# Patient Record
Sex: Female | Born: 2000
Health system: Southern US, Community
[De-identification: ages and names within clinical notes are randomized; demographics above are authoritative.]

## PROBLEM LIST (undated history)

## (undated) DIAGNOSIS — J302 Other seasonal allergic rhinitis: Secondary | ICD-10-CM

## (undated) DIAGNOSIS — K219 Gastro-esophageal reflux disease without esophagitis: Secondary | ICD-10-CM

## (undated) DIAGNOSIS — E063 Autoimmune thyroiditis: Secondary | ICD-10-CM

## (undated) DIAGNOSIS — F32A Depression, unspecified: Secondary | ICD-10-CM

## (undated) HISTORY — PX: OTHER SURGICAL HISTORY: SHX169

## (undated) HISTORY — DX: Autoimmune thyroiditis: E06.3

## (undated) HISTORY — DX: Other seasonal allergic rhinitis: J30.2

## (undated) NOTE — *Deleted (*Deleted)
It was a pleasure to see you in clinic today.   °Feel free to contact our office during normal business hours at [phone] with questions or concerns. °If you need us urgently after normal business hours, please call the above number to reach our answering service who will contact the on-call pediatric endocrinologist. ° °If you choose to communicate with us via MyChart, please do not send urgent messages as this inbox is NOT monitored on nights or weekends.  Urgent concerns should be discussed with the on-call pediatric endocrinologist. ° °-Take your thyroid medication at the same time every day °-If you forget to take a dose, take it as soon as you remember.  If you don't remember until the next day, take 2 doses then.  NEVER take more than 2 doses at a time. °-Use a pill box to help make it easier to keep track of doses   °

---
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
## 2020-01-06 ENCOUNTER — Ambulatory Visit (INDEPENDENT_AMBULATORY_CARE_PROVIDER_SITE_OTHER): Payer: BLUE CROSS/BLUE SHIELD | Admitting: Pediatrics

## 2020-01-06 NOTE — Progress Notes (Deleted)
Pediatric Endocrinology Follow-up Visit  Chief Complaint: acquired hypothyroidism  HPI: Jacqueline Green is a 19 y.o. female presenting for follow-up of the above concerns.  she attended this visit alone.  1. Jacqueline Green initially presented to [facility] in 06/2015 after her PCP diagnosed primary hypothyroidism.  She had been seen by her PCP on 01/27/15, at which time she complained of weight gain, hair breakage, and fatigue.  Lifestyle modifications were recommended at that time to prevent weight gain.  She went back to her PCP in 03/2015 with similar symptoms (weight gain notably) after making diet changes and blood work was done.  Labs obtained 04/29/2015 showed TSH 124.9, free T4 0.2, hemoglobin A1c 5.6%, CMP normal except ALT slightly elevated at 36 (upper limit of normal 35).  Initial diagnosis of hypothyroidism is consistent with autoimmune hypothyroidism; on 06/07/2015 TSH was 71.542, FT4 0.85, TPO Ab >900, thyroglobulin Ab elevated at 7.  She was started on levothyroxine daily in 2016.  Her dose has been titrated since.  She was also noted to have a mildly low vitamin D level in the past (01/2018-25-OH D level 29); 1000 units vitamin D daily was recommended.    2. Since last visit on 10/21/2019, she has been well.  Thyroid symptoms: Continues on brand name synthroid 25***mcg daily Missed doses: ***  Heat or cold intolerance: ***None Weight changes: *** creased ***lb since last visit Energy level: *** Sleep: *** Skin changes: ***None Hair loss: ***None Constipation/Diarrhea: ***None Difficulty swallowing: ***None Neck swelling: ***None ***Periods regular: *** Tremor: *** Palpitations: ***  Vitamin D deficiency: Most recent vitamin D level: 21 on 10/05/2019 Taking supplementation: yes, taking D3 2000 units daily  ROS: All systems reviewed with pertinent positives listed below; otherwise negative. Constitutional: Weight as above.  Sleeping as above HEENT: *** Respiratory: No increased work  of breathing currently GI: Stooling as above GU: ***puberty changes ***periods as above Musculoskeletal: No joint deformity Neuro: Normal affect Endocrine: As above  Past Medical History:   Past Medical History:  Diagnosis Date  . Acquired autoimmune hypothyroidism    Dx 04/2015.  TSH 124, FT4 0.2. TPO ab > 900  . Seasonal allergies    takes zyrtec and flonase prn   Meds: Augmentin 875mg  BID Brand name synthroid daily Zyrtec alternating with xyzal and flonase prn IUD Biotin "Hair" gummies Vitamin C, D3 (2000 daily), and zinc Occasionally takes a B12 vitamin if she feels she needs more energy  Allergies: Allergies  Allergen Reactions  . Strawberry (Diagnostic) Hives    Surgical History: Past Surgical History:  Procedure Laterality Date  . none      Family History:  Family History  Problem Relation Age of Onset  . Thyroid disease Mother        had benign tumor causing hyperthyroidism in 1991, underwent partial thyroidectomy.  Was on synthroid for years post-op, then around 2010 she was able to stop synthroid    . Hypertension Father   Multiple maternal family members with hypothyroidism including MGM, 3 maternal aunts, and 1 maternal uncle.  Pt has cousin with T1DM, otherwise no other autoimmune diseases in the family  Social History: Lives with: parents (older brother at college) Attends [employer]   Physical Exam:  There were no vitals filed for this visit. There were no vitals taken for this visit. Body mass index: body mass index is unknown because there is no height or weight on file. Blood pressure percentiles are not available for patients who are 18 years or older.  Wt Readings from Last 3 Encounters:  10/21/19 115 lb (52.2 kg) (30 %, Z= -0.52)*  10/05/19 116 lb 9.6 oz (52.9 kg) (34 %, Z= -0.42)*  07/07/19 127 lb (57.6 kg) (56 %, Z= 0.16)*   * Growth percentiles are based on CDC (Girls, 2-20 Years) data.   Ht Readings from Last 3 Encounters:   10/05/19 5' 1.81" (1.57 m) (17 %, Z= -0.95)*  02/19/18 5' 2.01" (1.575 m) (21 %, Z= -0.81)*  09/16/17 5' 2.21" (1.58 m) (24 %, Z= -0.71)*   * Growth percentiles are based on CDC (Girls, 2-20 Years) data.   There is no height or weight on file to calculate BMI.  No weight on file for this encounter. No height on file for this encounter.  General: Well developed, well nourished ***female in no acute distress.  Appears *** stated age Head: Normocephalic, atraumatic.   Eyes:  Pupils equal and round. EOMI.   Sclera white.  No eye drainage.   Ears/Nose/Mouth/Throat: Nares patent, no nasal drainage.  Normal dentition, mucous membranes moist.   Neck: supple, no cervical lymphadenopathy, no thyromegaly Cardiovascular: regular rate, normal S1/S2, no murmurs Respiratory: No increased work of breathing.  Lungs clear to auscultation bilaterally.  No wheezes. Abdomen: soft, nontender, nondistended. Normal bowel sounds.  No appreciable masses  Extremities: warm, well perfused, cap refill < 2 sec.   Musculoskeletal: Normal muscle mass.  Normal strength Skin: warm, dry.  No rash or lesions. Neurologic: alert and oriented, normal speech, no tremor  Laboratory Evaluation: 06/07/15 TSH 71.5, free T4 0.85 09/22/15 TSH 35.812, FT4 1.02, TPO Ab >900, thyroglobulin Ab 7 (<2) 10/20/15 TSH 6.293, FT4 1.09 12/27/15: TSH 0.103, FT4 2.09 02/07/16: TSH 0.82, FT4 1.6, T4 11.1   Results for orders placed or performed in visit on 10/21/19  T4, free  Result Value Ref Range   Free T4 2.0 (H) 0.8 - 1.4 ng/dL  TSH  Result Value Ref Range   TSH <0.01 (L) mIU/L  COMPLETE METABOLIC PANEL WITH GFR  Result Value Ref Range   Glucose, Bld 80 65 - 99 mg/dL   BUN 9 7 - 20 mg/dL   Creat 0.60 0.50 - 1.00 mg/dL   GFR, Est Non African American 133 > OR = 60 mL/min/1.19m2   GFR, Est African American 154 > OR = 60 mL/min/1.17m2   BUN/Creatinine Ratio NOT APPLICABLE 6 - 22 (calc)   Sodium 138 135 - 146 mmol/L   Potassium  4.2 3.8 - 5.1 mmol/L   Chloride 105 98 - 110 mmol/L   CO2 26 20 - 32 mmol/L   Calcium 9.5 8.9 - 10.4 mg/dL   Total Protein 6.5 6.3 - 8.2 g/dL   Albumin 4.3 3.6 - 5.1 g/dL   Globulin 2.2 2.0 - 3.8 g/dL (calc)   AG Ratio 2.0 1.0 - 2.5 (calc)   Total Bilirubin 0.6 0.2 - 1.1 mg/dL   Alkaline phosphatase (APISO) 70 36 - 128 U/L   AST 18 12 - 32 U/L   ALT 18 5 - 32 U/L  CBC with Differential/Platelet  Result Value Ref Range   WBC 7.9 4.5 - 13.0 Thousand/uL   RBC 4.73 3.80 - 5.10 Million/uL   Hemoglobin 12.4 11.5 - 15.3 g/dL   HCT 38.1 34.0 - 46.0 %   MCV 80.5 78.0 - 98.0 fL   MCH 26.2 25.0 - 35.0 pg   MCHC 32.5 31.0 - 36.0 g/dL   RDW 12.8 11.0 - 15.0 %   Platelets 208 140 -  400 Thousand/uL   MPV 11.2 7.5 - 12.5 fL   Neutro Abs 3,642 1,800 - 8,000 cells/uL   Lymphs Abs 2,955 1,200 - 5,200 cells/uL   Absolute Monocytes 450 200 - 900 cells/uL   Eosinophils Absolute 766 (H) 15 - 500 cells/uL   Basophils Absolute 87 0 - 200 cells/uL   Neutrophils Relative % 46.1 %   Total Lymphocyte 37.4 %   Monocytes Relative 5.7 %   Eosinophils Relative 9.7 %   Basophils Relative 1.1 %  T4  Result Value Ref Range   T4, Total 13.6 (H) 5.3 - 11.7 mcg/dL  POCT Glucose (Device for Home Use)  Result Value Ref Range   Glucose Fasting, POC     POC Glucose 100 (A) 70 - 99 mg/dl     Ref. Range 09/16/2017 16:07 02/19/2018 00:00 07/08/2019 08:31 10/05/2019 09:41  TSH Latest Units: mIU/L 0.87 0.51 0.01 (L) <0.01 (L)  T4,Free(Direct) Latest Ref Range: 0.8 - 1.4 ng/dL 1.6 (H) 1.3 2.5 (H) 2.3 (H)  Thyroxine (T4) Latest Ref Range: 5.3 - 11.7 mcg/dL 93.5 (H)  70.1 (H) 77.9 (H)     Ref. Range 07/08/2019 08:31  Mean Plasma Glucose Latest Units: (calc) 94  Vitamin D, 25-Hydroxy Latest Ref Range: 30 - 100 ng/mL 31  Cortisol, Plasma Latest Units: mcg/dL 39.0  eAG (mmol/L) Latest Units: (calc) 5.2  Hemoglobin A1C Latest Ref Range: <5.7 % of total Hgb 4.9   ASSESSMENT/PLAN:*** Kendal Ghazarian is a 19 y.o. female  with autoimmune acquired hypothyroidism who is clinically euthyroid on reduced synthroid dose.  Goal of treatment is TSH in the lower half of the normal range with FT4/T4 in the upper half of the normal range. I cannot explain why she had such a dramatic decrease in thyroid hormone replacement dosing over the past year besides dramatic decrease in weight; changed to brand name synthroid at a lower dose about 2 weeks ago as labs remained hyperthyroid despite decreasing levothyroxine doses.  She also has vitamin D deficiency on supplementation. She has recently had a horse bite to her finger with subsequent syncopal episode while treating wound; she was also subsequently found to have low blood pressure of unknown etiology.  A1c was normal.  She has had a recent morning cortisol level that was normal.    1. Autoimmune Acquired hypothyroidism 2. Loss of weight -Will draw TSH, FT4, T4 today -Continue current levothyroxine pending labs -She is aware of what to do in case of missed doses  3. Vitamin D deficiency -Continue current vitamin D supplement  4. Dizziness -Will check CBC, CMP, TFTs today. -Encouraged to drink gatorade and stay hydrated. Continue to monitor BPs periodically.   The best way to reach [facility] for lab results is [phone]  Follow-up:   3 months   ***   [name], MD

## 2020-03-09 ENCOUNTER — Ambulatory Visit (INDEPENDENT_AMBULATORY_CARE_PROVIDER_SITE_OTHER): Payer: Self-pay | Admitting: Pediatrics

## 2020-03-09 NOTE — Progress Notes (Deleted)
Pediatric Endocrinology Follow-up Visit  Chief Complaint: acquired hypothyroidism  HPI: Jacqueline Green is a 19 y.o. female presenting for follow-up of the above concerns.  she is accompanied to this visit by her ***.   ***She attended this visit alone.  1. Jacqueline Green initially presented to [facility] in 06/2015 after her PCP diagnosed primary hypothyroidism.  She had been seen by her PCP on 01/27/15, at which time she complained of weight gain, hair breakage, and fatigue.  Lifestyle modifications were recommended at that time to prevent weight gain.  She went back to her PCP in 03/2015 with similar symptoms (weight gain notably) after making diet changes and blood work was done.  Labs obtained 04/29/2015 showed TSH 124.9, free T4 0.2, hemoglobin A1c 5.6%, CMP normal except ALT slightly elevated at 36 (upper limit of normal 35).  Initial diagnosis of hypothyroidism is consistent with autoimmune hypothyroidism; on 06/07/2015 TSH was 71.542, FT4 0.85, TPO Ab >900, thyroglobulin Ab elevated at 7.  She was started on levothyroxine 50mcg daily in 2016.  Her dose has been titrated since.  She was also noted to have a mildly low vitamin D level in the past (01/2018-25-OH D level 29); 1000 units vitamin D daily was recommended.    2. Since last visit on 10/21/2019, she has been well.***  Thyroid symptoms: Brand name synthroid 44mcg daily Missed doses: ***   Weight changes: *** Energy level: *** Sleep: *** Constipation/Diarrhea: *** Difficulty swallowing: *** Periods: No periods since IUD placement.  *** Tremor: *** Palpitations: ***  Vitamin D deficiency: Most recent vitamin D level: 21 on 10/05/2019 Taking supplementation: *** D3 2000 units daily  ROS: All systems reviewed with pertinent positives listed below; otherwise negative. Constitutional: Weight as above.  Sleeping as above HEENT: *** Respiratory: No increased work of breathing currently GI: Stooling as above GU: Periods as above Musculoskeletal: No  joint deformity Neuro: Normal affect Endocrine: As above  Past Medical History:   Past Medical History:  Diagnosis Date  . Acquired autoimmune hypothyroidism    Dx 04/2015.  TSH 124, FT4 0.2. TPO ab > 900  . Seasonal allergies    takes zyrtec and flonase prn   Meds: Outpatient Encounter Medications as of 03/09/2020  Medication Sig  . amoxicillin-clavulanate (AUGMENTIN) 875-125 MG tablet Take 1 tablet by mouth 2 (two) times daily.  . Biotin 1000 MCG CHEW Chew by mouth.  . cetirizine (ZYRTEC) 10 MG tablet Take 10 mg by mouth daily.  . cholecalciferol (VITAMIN D3) 25 MCG (1000 UT) tablet Take 1,000 Units by mouth daily.  . fluticasone (FLONASE) 50 MCG/ACT nasal spray Place into the nose.  . levocetirizine (XYZAL) 5 MG tablet Take 5 mg by mouth every evening.  Marland Kitchen levonorgestrel (MIRENA) 20 MCG/24HR IUD 1 each by Intrauterine route once.  . Melatonin 10 MG TABS Take by mouth.  . SYNTHROID 25 MCG tablet Take 1 tablet (25 mcg total) by mouth daily.  Bertram Millard, 28, 0.18/0.215/0.25 MG-35 MCG tablet    No facility-administered encounter medications on file as of 03/09/2020.   Allergies: Allergies  Allergen Reactions  . Strawberry (Diagnostic) Hives    Surgical History: Past Surgical History:  Procedure Laterality Date  . none      Family History:  Family History  Problem Relation Age of Onset  . Thyroid disease Mother        had benign tumor causing hyperthyroidism in 1991, underwent partial thyroidectomy.  Was on synthroid for years post-op, then around 2010 she was able to stop  synthroid    . Hypertension Father   Multiple maternal family members with hypothyroidism including MGM, 3 maternal aunts, and 1 maternal uncle.  Pt has cousin with T1DM, otherwise no other autoimmune diseases in the family  Social History: Lives with: parents (older brother at college) Attends [employer]   Physical Exam:  There were no vitals filed for this visit. There were no vitals taken for this  visit. Body mass index: body mass index is unknown because there is no height or weight on file. Blood pressure percentiles are not available for patients who are 18 years or older.  Wt Readings from Last 3 Encounters:  10/21/19 115 lb (52.2 kg) (30 %, Z= -0.52)*  10/05/19 116 lb 9.6 oz (52.9 kg) (34 %, Z= -0.42)*  07/07/19 127 lb (57.6 kg) (56 %, Z= 0.16)*   * Growth percentiles are based on CDC (Girls, 2-20 Years) data.   Ht Readings from Last 3 Encounters:  10/05/19 5' 1.81" (1.57 m) (17 %, Z= -0.95)*  02/19/18 5' 2.01" (1.575 m) (21 %, Z= -0.81)*  09/16/17 5' 2.21" (1.58 m) (24 %, Z= -0.71)*   * Growth percentiles are based on CDC (Girls, 2-20 Years) data.   There is no height or weight on file to calculate BMI.  No weight on file for this encounter. No height on file for this encounter.  General: Well developed, well nourished ***female in no acute distress.  Appears *** stated age Head: Normocephalic, atraumatic.   Eyes:  Pupils equal and round. EOMI.   Sclera white.  No eye drainage.   Ears/Nose/Mouth/Throat: Nares patent, no nasal drainage.  Normal dentition, mucous membranes moist.   Neck: supple, no cervical lymphadenopathy, no thyromegaly Cardiovascular: regular rate, normal S1/S2, no murmurs Respiratory: No increased work of breathing.  Lungs clear to auscultation bilaterally.  No wheezes. Abdomen: soft, nontender, nondistended. Normal bowel sounds.  No appreciable masses  Genitourinary: Tanner *** breasts, *** axillary hair, Tanner *** pubic hair Extremities: warm, well perfused, cap refill < 2 sec.   Musculoskeletal: Normal muscle mass.  Normal strength Skin: warm, dry.  No rash or lesions. Neurologic: alert and oriented, normal speech, no tremor  Laboratory Evaluation: 06/07/15 TSH 71.5, free T4 0.85 09/22/15 TSH 35.812, FT4 1.02, TPO Ab >900, thyroglobulin Ab 7 (<2) 10/20/15 TSH 6.293, FT4 1.09 12/27/15: TSH 0.103, FT4 2.09 02/07/16: TSH 0.82, FT4 1.6, T4  11.1   Results for orders placed or performed in visit on 10/21/19  T4, free  Result Value Ref Range   Free T4 2.0 (H) 0.8 - 1.4 ng/dL  TSH  Result Value Ref Range   TSH <0.01 (L) mIU/L  COMPLETE METABOLIC PANEL WITH GFR  Result Value Ref Range   Glucose, Bld 80 65 - 99 mg/dL   BUN 9 7 - 20 mg/dL   Creat 0.09 2.33 - 0.07 mg/dL   GFR, Est Non African American 133 > OR = 60 mL/min/1.40m2   GFR, Est African American 154 > OR = 60 mL/min/1.28m2   BUN/Creatinine Ratio NOT APPLICABLE 6 - 22 (calc)   Sodium 138 135 - 146 mmol/L   Potassium 4.2 3.8 - 5.1 mmol/L   Chloride 105 98 - 110 mmol/L   CO2 26 20 - 32 mmol/L   Calcium 9.5 8.9 - 10.4 mg/dL   Total Protein 6.5 6.3 - 8.2 g/dL   Albumin 4.3 3.6 - 5.1 g/dL   Globulin 2.2 2.0 - 3.8 g/dL (calc)   AG Ratio 2.0 1.0 - 2.5 (calc)  Total Bilirubin 0.6 0.2 - 1.1 mg/dL   Alkaline phosphatase (APISO) 70 36 - 128 U/L   AST 18 12 - 32 U/L   ALT 18 5 - 32 U/L  CBC with Differential/Platelet  Result Value Ref Range   WBC 7.9 4.5 - 13.0 Thousand/uL   RBC 4.73 3.80 - 5.10 Million/uL   Hemoglobin 12.4 11.5 - 15.3 g/dL   HCT 78.2 95.6 - 21.3 %   MCV 80.5 78.0 - 98.0 fL   MCH 26.2 25.0 - 35.0 pg   MCHC 32.5 31.0 - 36.0 g/dL   RDW 08.6 57.8 - 46.9 %   Platelets 208 140 - 400 Thousand/uL   MPV 11.2 7.5 - 12.5 fL   Neutro Abs 3,642 1,800 - 8,000 cells/uL   Lymphs Abs 2,955 1,200 - 5,200 cells/uL   Absolute Monocytes 450 200 - 900 cells/uL   Eosinophils Absolute 766 (H) 15 - 500 cells/uL   Basophils Absolute 87 0 - 200 cells/uL   Neutrophils Relative % 46.1 %   Total Lymphocyte 37.4 %   Monocytes Relative 5.7 %   Eosinophils Relative 9.7 %   Basophils Relative 1.1 %  T4  Result Value Ref Range   T4, Total 13.6 (H) 5.3 - 11.7 mcg/dL  POCT Glucose (Device for Home Use)  Result Value Ref Range   Glucose Fasting, POC     POC Glucose 100 (A) 70 - 99 mg/dl     Ref. Range 09/16/2017 16:07 02/19/2018 00:00 07/08/2019 08:31 10/05/2019 09:41   TSH Latest Units: mIU/L 0.87 0.51 0.01 (L) <0.01 (L)  T4,Free(Direct) Latest Ref Range: 0.8 - 1.4 ng/dL 1.6 (H) 1.3 2.5 (H) 2.3 (H)  Thyroxine (T4) Latest Ref Range: 5.3 - 11.7 mcg/dL 62.9 (H)  52.8 (H) 41.3 (H)     Ref. Range 07/08/2019 08:31  Mean Plasma Glucose Latest Units: (calc) 94  Vitamin D, 25-Hydroxy Latest Ref Range: 30 - 100 ng/mL 31  Cortisol, Plasma Latest Units: mcg/dL 24.4  eAG (mmol/L) Latest Units: (calc) 5.2  Hemoglobin A1C Latest Ref Range: <5.7 % of total Hgb 4.9   ASSESSMENT/PLAN: Martha Ellerby is a 19 y.o. female with autoimmune acquired hypothyroidism who is clinically ***thyroid on levothyroxine treatment. Levothyroxine needs have decreased dramatically over the past year for unknown reasons. ***Growth and weight gain are normal.  Goal of treatment is TSH in the lower half of the normal range with FT4/T4 in the upper half of the normal range.   1. Autoimmune Acquired hypothyroidism 2. ***  -Reviewed pituitary/thyroid axis with the family -Will draw TSH, FT4, T4 today -Continue current levothyroxine pending labs -Discussed what to do in case of missed doses -Growth chart reviewed with family    The best way to reach [facility] for lab results is [phone]  Follow-up:   3 months   ***   [name], MD

## 2020-04-20 ENCOUNTER — Ambulatory Visit (INDEPENDENT_AMBULATORY_CARE_PROVIDER_SITE_OTHER): Payer: No Typology Code available for payment source | Admitting: Pediatrics

## 2020-04-20 ENCOUNTER — Other Ambulatory Visit: Payer: Self-pay

## 2020-04-20 ENCOUNTER — Encounter (INDEPENDENT_AMBULATORY_CARE_PROVIDER_SITE_OTHER): Payer: Self-pay | Admitting: Pediatrics

## 2020-04-20 VITALS — BP 104/58 | HR 62 | Ht 62.68 in | Wt 120.4 lb

## 2020-04-20 DIAGNOSIS — E559 Vitamin D deficiency, unspecified: Secondary | ICD-10-CM | POA: Diagnosis not present

## 2020-04-20 DIAGNOSIS — E063 Autoimmune thyroiditis: Secondary | ICD-10-CM

## 2020-04-20 NOTE — Progress Notes (Signed)
Pediatric Endocrinology Follow-up Visit  Chief Complaint: acquired hypothyroidism  HPI: Jacqueline Green is a 19 y.o. female presenting for follow-up of the above concerns.  she attended this visit alone.     1. Jacqueline Green initially presented to [facility] in 06/2015 after her PCP diagnosed primary hypothyroidism.  She had been seen by her PCP on 01/27/15, at which time she complained of weight gain, hair breakage, and fatigue.  Lifestyle modifications were recommended at that time to prevent weight gain.  She went back to her PCP in 03/2015 with similar symptoms (weight gain notably) after making diet changes and blood work was done.  Labs obtained 04/29/2015 showed TSH 124.9, free T4 0.2, hemoglobin A1c 5.6%, CMP normal except ALT slightly elevated at 36 (upper limit of normal 35).  Initial diagnosis of hypothyroidism is consistent with autoimmune hypothyroidism; on 06/07/2015 TSH was 71.542, FT4 0.85, TPO Ab >900, thyroglobulin Ab elevated at 7.  She was started on levothyroxine daily in 2016.  Her dose has been titrated since.  She was also noted to have a mildly low vitamin D level in the past (01/2018-25-OH D level 29); 1000 units vitamin D daily was recommended.    2. Since last visit on 10/21/2019, she has been well.  Synthroid dose changed at last visit to daily as TSH remained suppressed and FT4 elevated.   A little tired, but feels like her thyroid dose is "not far off"  Thyroid symptoms: Brand name synthroid daily Missed doses: none  Weight changes: fluctuates 5lb (115-120lb).  Not trying to lose weight.  Still working at [employer] Energy level: ranks energy level as a 7 out of 8, used to be 4 Sleep: Sleeps "like a rock". Gets up at 5AM, nap from 11-1 daily Constipation/Diarrhea: None Difficulty swallowing: no Periods regular: No periods since IUD placement Tremor: No Palpitations: No  Occasional dizziness if standing too fast or if moves when hot, gets dizzy when very tired.  Drinks a lot during the day (water and coke)  Vitamin D deficiency: Most recent vitamin D level: 21 on 10/05/2019 Taking supplementation: yes, taking D3 2000 units daily  ROS: All systems reviewed with pertinent positives listed below; otherwise negative.  Past Medical History:   Past Medical History:  Diagnosis Date  . Acquired autoimmune hypothyroidism    Dx 04/2015.  TSH 124, FT4 0.2. TPO ab > 900  . Seasonal allergies    takes zyrtec and flonase prn   Meds: Outpatient Encounter Medications as of 04/20/2020  Medication Sig Note  . Biotin 1000 MCG CHEW Chew by mouth.   . cetirizine (ZYRTEC) 10 MG tablet Take 10 mg by mouth daily. 04/20/2020: PRN  . cholecalciferol (VITAMIN D3) 25 MCG (1000 UT) tablet Take 1,000 Units by mouth daily.   Marland Kitchen levonorgestrel (KYLEENA) 19.5 MG IUD by Intrauterine route once.   Marland Kitchen SYNTHROID 25 MCG tablet Take 1 tablet (25 mcg total) by mouth daily.   . fluticasone (FLONASE) 50 MCG/ACT nasal spray Place into the nose.   . levocetirizine (XYZAL) 5 MG tablet Take 5 mg by mouth every evening. 04/20/2020: PRN switches with zyrtec   . Melatonin 10 MG TABS Take by mouth. 04/20/2020: Only when she can't sleep  . [DISCONTINUED] amoxicillin-clavulanate (AUGMENTIN) 875-125 MG tablet Take 1 tablet by mouth 2 (two) times daily.   . [DISCONTINUED] levonorgestrel (MIRENA) 20 MCG/24HR IUD 1 each by Intrauterine route once. 4/29/2021[name] brand  . [DISCONTINUED] TRINESSA, 28, 0.18/0.215/0.25 MG-35 MCG tablet  No facility-administered encounter medications on file as of 04/20/2020.    Allergies: Allergies  Allergen Reactions  . Strawberry (Diagnostic) Hives    Surgical History: Past Surgical History:  Procedure Laterality Date  . none      Family History:  Family History  Problem Relation Age of Onset  . Thyroid disease Mother        had benign tumor causing hyperthyroidism in 1991, underwent partial thyroidectomy.  Was on synthroid for years post-op, then  around 2010 she was able to stop synthroid    . Hypertension Father   Multiple maternal family members with hypothyroidism including MGM, 3 maternal aunts, and 1 maternal uncle.  Pt has cousin with T1DM, otherwise no other autoimmune diseases in the family  Social History: Lives with: parents (older brother at college) Attends [employer]   Physical Exam:  Vitals:   04/20/20 1056  BP: (!) 104/58  Pulse: 62  Weight: 120 lb 6.4 oz (54.6 kg)  Height: 5' 2.68" (1.592 m)   BP (!) 104/58   Pulse 62   Ht 5' 2.68" (1.592 m)   Wt 120 lb 6.4 oz (54.6 kg)   LMP 04/17/2020   BMI 21.55 kg/m  Body mass index: body mass index is 21.55 kg/m. Blood pressure percentiles are not available for patients who are 18 years or older.  Wt Readings from Last 3 Encounters:  04/20/20 120 lb 6.4 oz (54.6 kg) (39 %, Z= -0.27)*  10/21/19 115 lb (52.2 kg) (30 %, Z= -0.52)*  10/05/19 116 lb 9.6 oz (52.9 kg) (34 %, Z= -0.42)*   * Growth percentiles are based on CDC (Girls, 2-20 Years) data.   Ht Readings from Last 3 Encounters:  04/20/20 5' 2.68" (1.592 m) (27 %, Z= -0.62)*  10/05/19 5' 1.81" (1.57 m) (17 %, Z= -0.95)*  02/19/18 5' 2.01" (1.575 m) (21 %, Z= -0.81)*   * Growth percentiles are based on CDC (Girls, 2-20 Years) data.   Body mass index is 21.55 kg/m.  39 %ile (Z= -0.27) based on CDC (Girls, 2-20 Years) weight-for-age data using vitals from 04/20/2020. 27 %ile (Z= -0.62) based on CDC (Girls, 2-20 Years) Stature-for-age data based on Stature recorded on 04/20/2020.  General: Well developed, well nourished female in no acute distress.  Appears stated age Head: Normocephalic, atraumatic.   Eyes:  Pupils equal and round. EOMI.   Sclera white.  No eye drainage.   Ears/Nose/Mouth/Throat: Masked Neck: supple, no cervical lymphadenopathy, no thyromegaly Cardiovascular: regular rate, normal S1/S2, no murmurs Respiratory: No increased work of breathing.  Lungs clear to auscultation bilaterally.  No  wheezes. Abdomen: soft, nontender, nondistended. Normal bowel sounds.  No appreciable masses  Extremities: warm, well perfused, cap refill < 2 sec.   Musculoskeletal: Normal muscle mass.  Normal strength Skin: warm, dry.  No rash or lesions. Neurologic: alert and oriented, normal speech, no tremor  Laboratory Evaluation: 06/07/15 TSH 71.5, free T4 0.85 09/22/15 TSH 35.812, FT4 1.02, TPO Ab >900, thyroglobulin Ab 7 (<2) 10/20/15 TSH 6.293, FT4 1.09 12/27/15: TSH 0.103, FT4 2.09 02/07/16: TSH 0.82, FT4 1.6, T4 11.1    Ref. Range 10/21/2019 10:58  Sodium Latest Ref Range: 135 - 146 mmol/L 138  Potassium Latest Ref Range: 3.8 - 5.1 mmol/L 4.2  Chloride Latest Ref Range: 98 - 110 mmol/L 105  CO2 Latest Ref Range: 20 - 32 mmol/L 26  Glucose Latest Ref Range: 65 - 99 mg/dL 80  BUN Latest Ref Range: 7 - 20 mg/dL 9  Creatinine  Latest Ref Range: 0.50 - 1.00 mg/dL 0.35  Calcium Latest Ref Range: 8.9 - 10.4 mg/dL 9.5  BUN/Creatinine Ratio Latest Ref Range: 6 - 22 (calc) NOT APPLICABLE  AG Ratio Latest Ref Range: 1.0 - 2.5 (calc) 2.0  AST Latest Ref Range: 12 - 32 U/L 18  ALT Latest Ref Range: 5 - 32 U/L 18  Total Protein Latest Ref Range: 6.3 - 8.2 g/dL 6.5  Total Bilirubin Latest Ref Range: 0.2 - 1.1 mg/dL 0.6  GFR, Est Non African American Latest Ref Range: > OR = 60 mL/min/1.47m2 133  GFR, Est African American Latest Ref Range: > OR = 60 mL/min/1.62m2 154  Alkaline phosphatase (APISO) Latest Ref Range: 36 - 128 U/L 70  Globulin Latest Ref Range: 2.0 - 3.8 g/dL (calc) 2.2  WBC Latest Ref Range: 4.5 - 13.0 Thousand/uL 7.9  RBC Latest Ref Range: 3.80 - 5.10 Million/uL 4.73  Hemoglobin Latest Ref Range: 11.5 - 15.3 g/dL 00.9  HCT Latest Ref Range: 34.0 - 46.0 % 38.1  MCV Latest Ref Range: 78.0 - 98.0 fL 80.5  MCH Latest Ref Range: 25.0 - 35.0 pg 26.2  MCHC Latest Ref Range: 31.0 - 36.0 g/dL 38.1  RDW Latest Ref Range: 11.0 - 15.0 % 12.8  Platelets Latest Ref Range: 140 - 400 Thousand/uL 208   MPV Latest Ref Range: 7.5 - 12.5 fL 11.2  Neutrophils Latest Units: % 46.1  Monocytes Relative Latest Units: % 5.7  Eosinophil Latest Units: % 9.7  Basophil Latest Units: % 1.1  NEUT# Latest Ref Range: 1,800 - 8,000 cells/uL 3,642  Lymphocyte # Latest Ref Range: 1,200 - 5,200 cells/uL 2,955  Total Lymphocyte Latest Units: % 37.4  Eosinophils Absolute Latest Ref Range: 15 - 500 cells/uL 766 (H)  Basophils Absolute Latest Ref Range: 0 - 200 cells/uL 87  Absolute Monocytes Latest Ref Range: 200 - 900 cells/uL 450  TSH Latest Units: mIU/L <0.01 (L)  T4,Free(Direct) Latest Ref Range: 0.8 - 1.4 ng/dL 2.0 (H)  Thyroxine (T4) Latest Ref Range: 5.3 - 11.7 mcg/dL 82.9 (H)  Albumin MSPROF Latest Ref Range: 3.6 - 5.1 g/dL 4.3     Ref. Range 09/16/2017 16:07 02/19/2018 00:00 07/08/2019 08:31 10/05/2019 09:41  TSH Latest Units: mIU/L 0.87 0.51 0.01 (L) <0.01 (L)  T4,Free(Direct) Latest Ref Range: 0.8 - 1.4 ng/dL 1.6 (H) 1.3 2.5 (H) 2.3 (H)  Thyroxine (T4) Latest Ref Range: 5.3 - 11.7 mcg/dL 93.7 (H)  16.9 (H) 67.8 (H)     Ref. Range 07/08/2019 08:31  Mean Plasma Glucose Latest Units: (calc) 94  Vitamin D, 25-Hydroxy Latest Ref Range: 30 - 100 ng/mL 31  Cortisol, Plasma Latest Units: mcg/dL 93.8  eAG (mmol/L) Latest Units: (calc) 5.2  Hemoglobin A1C Latest Ref Range: <5.7 % of total Hgb 4.9   ASSESSMENT/PLAN: Jacqueline Green is a 20 y.o. female with autoimmune acquired hypothyroidism who is clinically euthyroid to slightly hypothyroid on reduced synthroid dose (+ fatigue, low HR).  Goal of treatment is TSH in the lower half of the normal range with FT4/T4 in the upper half of the normal range. She has been a challenging case as levothyroxine needs have dropped dramatically over the past year.  She is also taking biotin, which may interfere with TSH lab testing. She also has vitamin D deficiency on supplementation.   1. Autoimmune Acquired hypothyroidism  -Will draw TSH, FT4, T4 today -Continue  current levothyroxine pending labs.  Reviewed that next dose up would be 37.74mcg daily -She is aware of what  to do in case of missed doses  2. Vitamin D deficiency -Continue current vitamin D supplement -Repeat 25-OH D level today  The best way to reach [facility] for lab results is [phone]  Follow-up:   3 months   >30 minutes spent today reviewing the medical chart, counseling the patient/family, and documenting today's encounter.  [name], MD  -------------------------------- 04/21/20 11:06 AM ADDENDUM: Labs show low TSH with low normal FT4/T4.  Biotin can cause falsely low results of TSH level.  Given clinical picture, will increase synthroid to 37.19mcg daily (can take 1.5 of current tabs then increase to half of tab).  Will ask her to stop biotin for the next 3 months and will repeat labs again at next visit in 3 months.  Vitamin D level just below normal, will increase vitamin D supplement slightly (take an extra vitamin D tablet daily on Sat/Sun).  Will have my nursing staff contact her with results/plan.  Results for orders placed or performed in visit on 04/20/20  T4, free  Result Value Ref Range   Free T4 1.1 0.8 - 1.4 ng/dL  T4  Result Value Ref Range   T4, Total 7.3 5.3 - 11.7 mcg/dL  TSH  Result Value Ref Range   TSH 0.01 (L) mIU/L  VITAMIN D 25 Hydroxy (Vit-D Deficiency, Fractures)  Result Value Ref Range   Vit D, 25-Hydroxy 26 (L) 30 - 100 ng/mL

## 2020-04-20 NOTE — Patient Instructions (Addendum)
It was a pleasure to see you in clinic today.   Feel free to contact our office during normal business hours at [phone] with questions or concerns. If you need [place] urgently after normal business hours, please call the above number to reach our answering service who will contact the on-call pediatric endocrinologist.  If you choose to communicate with [place] via MyChart, please do not send urgent messages as this inbox is NOT monitored on nights or weekends.  Urgent concerns should be discussed with the on-call pediatric endocrinologist.  -Take your thyroid medication at the same time every day -If you forget to take a dose, take it as soon as you remember.  If you don't remember until the next day, take 2 doses then.  NEVER take more than 2 doses at a time. -Use a pill box to help make it easier to keep track of doses   I will be in touch with lab results  Please go to the following address to have labs drawn after today's visit: [address] [place], [place] [address]

## 2020-04-21 ENCOUNTER — Telehealth (INDEPENDENT_AMBULATORY_CARE_PROVIDER_SITE_OTHER): Payer: Self-pay

## 2020-04-21 LAB — VITAMIN D 25 HYDROXY (VIT D DEFICIENCY, FRACTURES): Vit D, 25-Hydroxy: 26 ng/mL — ABNORMAL LOW (ref 30–100)

## 2020-04-21 LAB — T4, FREE: Free T4: 1.1 ng/dL (ref 0.8–1.4)

## 2020-04-21 LAB — TSH: TSH: 0.01 mIU/L — ABNORMAL LOW

## 2020-04-21 LAB — T4: T4, Total: 7.3 ug/dL (ref 5.3–11.7)

## 2020-04-21 MED ORDER — SYNTHROID 75 MCG PO TABS: 37.5000 ug | ORAL_TABLET | Freq: Every day | ORAL | 5 refills | Status: DC

## 2020-04-21 NOTE — Telephone Encounter (Signed)
Spoke with patient informed her per Dr. Larinda Buttery  "Your TSH level is low, and I think it is falsely low due to the biotin you are taking.  Your other thyroid labs are at the lower end of normal and we have room to increase your synthroid dose.  Please increase synthroid to 37.64mcg daily (you can take 1.5 of the tabs until you run out of your current bottle, then when you pick up your new prescription you will take half of a tablet daily).  Please stop taking biotin for the next 3 months and we will repeat your thyroid labs when you come back.    Your vitamin D level is improved though still just below normal.  Please increase the vitamin D amount you take on Saturday and Sunday by 1 pill (so if you are taking 1 vitamin D tablet per day on Sat/Sun, increase it to 2 pills daily Sat/Sun)."  Patient verbalized understanding.

## 2020-04-21 NOTE — Addendum Note (Signed)
Addended by: Judene Companion on: 04/21/2020 11:29 AM   Modules accepted: Orders

## 2020-04-21 NOTE — Telephone Encounter (Signed)
-----   Message from [name], MD sent at 04/21/2020 11:27 AM EDT ----- Your TSH level is low, and I think it is falsely low due to the biotin you are taking.  Your other thyroid labs are at the lower end of normal and we have room to increase your synthroid dose.  Please increase synthroid to 37.50mcg daily (you can take 1.5 of the tabs until you run out of your current bottle, then when you pick up your new prescription you will take half of a tablet daily).  Please stop taking biotin for the next 3 months and we will repeat your thyroid labs when you come back.    Your vitamin D level is improved though still just below normal.  Please increase the vitamin D amount you take on Saturday and Sunday by 1 pill (so if you are taking 1 vitamin D tablet per day on Sat/Sun, increase it to 2 pills daily Sat/Sun).  Please call her at [phone] to let her know the above information. Thanks!

## 2020-07-20 ENCOUNTER — Ambulatory Visit (INDEPENDENT_AMBULATORY_CARE_PROVIDER_SITE_OTHER): Payer: No Typology Code available for payment source | Admitting: Pediatrics

## 2020-07-20 ENCOUNTER — Other Ambulatory Visit: Payer: Self-pay

## 2020-07-20 ENCOUNTER — Encounter (INDEPENDENT_AMBULATORY_CARE_PROVIDER_SITE_OTHER): Payer: Self-pay | Admitting: Pediatrics

## 2020-07-20 VITALS — BP 112/68 | HR 72 | Ht 62.64 in | Wt 110.6 lb

## 2020-07-20 DIAGNOSIS — E063 Autoimmune thyroiditis: Secondary | ICD-10-CM | POA: Diagnosis not present

## 2020-07-20 DIAGNOSIS — E559 Vitamin D deficiency, unspecified: Secondary | ICD-10-CM

## 2020-07-20 NOTE — Progress Notes (Addendum)
Pediatric Endocrinology Follow-up Visit  Chief Complaint: acquired hypothyroidism  HPI: Jacqueline Green is a 19 y.o. female presenting for follow-up of the above concerns.  she attended this visit alone.     1. Jacqueline Green initially presented to [facility] in 06/2015 after her PCP diagnosed primary hypothyroidism.  She had been seen by her PCP on 01/27/15, at which time she complained of weight gain, hair breakage, and fatigue.  Lifestyle modifications were recommended at that time to prevent weight gain.  She went back to her PCP in 03/2015 with similar symptoms (weight gain notably) after making diet changes and blood work was done.  Labs obtained 04/29/2015 showed TSH 124.9, free T4 0.2, hemoglobin A1c 5.6%, CMP normal except ALT slightly elevated at 36 (upper limit of normal 35).  Initial diagnosis of hypothyroidism is consistent with autoimmune hypothyroidism; on 06/07/2015 TSH was 71.542, FT4 0.85, TPO Ab >900, thyroglobulin Ab elevated at 7.  She was started on levothyroxine daily in 2016.  Her dose has been titrated since.  She was also noted to have a mildly low vitamin D level in the past (01/2018-25-OH D level 29); 1000 units vitamin D daily was recommended.    2. Since last visit on 04/20/20, she has been well.  Recently in [place], sweated a lot while there.  Lost 5lb according to her home scale.  Feels great.  Has more energy than she has had in a while.  Stopped biotin at her last visit with me.   Thyroid symptoms: Brand name synthroid 37.77mcg daily Missed doses: None  Weight changes: down 10lb.  Eating well.  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  Better when taken with food.  Interested in weekly ergocalciferol if she still needs vitamin D supplement  ROS: All systems reviewed with pertinent positives listed below; otherwise negative. Constitutional: Weight has decreased 10lb since last visit.   No further dizziness.  Hair healthier and less brittle  Past Medical History:   Past Medical History:  Diagnosis Date  . Acquired autoimmune hypothyroidism    Dx 04/2015.  TSH 124, FT4 0.2. TPO ab > 900  . Seasonal allergies    takes zyrtec and flonase prn   Meds: Outpatient Encounter Medications as of 07/20/2020  Medication Sig Note  . cetirizine (ZYRTEC) 10 MG tablet Take 10 mg by mouth daily. 04/20/2020: PRN  . cholecalciferol (VITAMIN D3) 25 MCG (1000 UT) tablet Take 1,000 Units by mouth daily.   Marland Kitchen levocetirizine (XYZAL) 5 MG tablet Take 5 mg by mouth every evening. 04/20/2020: PRN switches with zyrtec   . levonorgestrel (KYLEENA) 19.5 MG IUD by Intrauterine route once.   . Melatonin 10 MG TABS Take by mouth. 04/20/2020: Only when she can't sleep  . SYNTHROID 75 MCG tablet Take 0.5 tablets (37.5 mcg total) by mouth daily.   . Biotin 1000 MCG CHEW Chew by mouth. (Patient not taking: Reported on 07/20/2020)   . fluticasone (FLONASE) 50 MCG/ACT nasal spray Place into the nose. (Patient not taking: Reported on 07/20/2020)    No facility-administered encounter medications on file as of 07/20/2020.    Allergies: Allergies  Allergen Reactions  . Strawberry (Diagnostic) Hives    Surgical History: Past Surgical History:  Procedure Laterality Date  . none      Family History:  Family History  Problem Relation Age of Onset  . Thyroid disease Mother        had benign tumor causing hyperthyroidism in 1991, underwent partial thyroidectomy.  Was on synthroid for years post-op, then around 2010 she was able to stop synthroid    . Hypertension Father   Multiple  maternal family members with hypothyroidism including MGM, 3 maternal aunts, and 1 maternal uncle.  Pt has cousin with T1DM, otherwise no other autoimmune diseases in the family  Social History: Lives with: parents (older brother at college) Attends [employer], no classes this summer  Physical Exam:  Vitals:   07/20/20 1022  BP: 112/68  Pulse: 72  Weight: 110 lb 10.1 oz (50.2 kg)  Height: 5' 2.64" (1.591 m)   BP 112/68   Pulse 72   Ht 5' 2.64" (1.591 m)   Wt 110 lb 10.1 oz (50.2 kg)   BMI 19.82 kg/m  Body mass index: body mass index is 19.82 kg/m. Blood pressure percentiles are not available for patients who are 18 years or older.  Wt Readings from Last 3 Encounters:  07/20/20 110 lb 10.1 oz (50.2 kg) (18 %, Z= -0.91)*  04/20/20 120 lb 6.4 oz (54.6 kg) (39 %, Z= -0.27)*  10/21/19 115 lb (52.2 kg) (30 %, Z= -0.52)*   * Growth percentiles are based on CDC (Girls, 2-20 Years) data.   Ht Readings from Last 3 Encounters:  07/20/20 5' 2.64" (1.591 m) (26 %, Z= -0.64)*  04/20/20 5' 2.68" (1.592 m) (27 %, Z= -0.62)*  10/05/19 5' 1.81" (1.57 m) (17 %, Z= -0.95)*   * Growth percentiles are based on CDC (Girls, 2-20 Years) data.   Body mass index is 19.82 kg/m.  18 %ile (Z= -0.91) based on CDC (Girls, 2-20 Years) weight-for-age data using vitals from 07/20/2020. 26 %ile (Z= -0.64) based on CDC (Girls, 2-20 Years) Stature-for-age data based on Stature recorded on 07/20/2020.  General: Well developed, well nourished female in no acute distress.  Appears stated age Head: Normocephalic, atraumatic.   Eyes:  Pupils equal and round. EOMI.   Sclera white.  No eye drainage.   Ears/Nose/Mouth/Throat: Masked Neck: supple, no cervical lymphadenopathy, no thyromegaly Cardiovascular: regular rate, normal S1/S2, no murmurs Respiratory: No increased work of breathing.  Lungs clear to auscultation bilaterally.  No wheezes. Abdomen: soft, nontender, nondistended.  Extremities: warm, well perfused,  cap refill < 2 sec.   Musculoskeletal: Normal muscle mass.  Normal strength Skin: warm, dry.  No rash or lesions. Neurologic: alert and oriented, normal speech, no tremor  Laboratory Evaluation: 06/07/15 TSH 71.5, free T4 0.85 09/22/15 TSH 35.812, FT4 1.02, TPO Ab >900, thyroglobulin Ab 7 (<2) 10/20/15 TSH 6.293, FT4 1.09 12/27/15: TSH 0.103, FT4 2.09 02/07/16: TSH 0.82, FT4 1.6, T4 11.1    Ref. Range 10/21/2019 10:58  Sodium Latest Ref Range: 135 - 146 mmol/L 138  Potassium Latest Ref Range: 3.8 - 5.1 mmol/L 4.2  Chloride Latest Ref Range: 98 - 110 mmol/L 105  CO2 Latest Ref Range: 20 - 32 mmol/L 26  Glucose Latest Ref Range: 65 - 99 mg/dL 80  BUN Latest Ref Range: 7 - 20 mg/dL 9  Creatinine Latest Ref Range: 0.50 - 1.00 mg/dL 2.75  Calcium Latest Ref Range: 8.9 - 10.4 mg/dL 9.5  BUN/Creatinine Ratio Latest Ref Range: 6 -  22 (calc) NOT APPLICABLE  AG Ratio Latest Ref Range: 1.0 - 2.5 (calc) 2.0  AST Latest Ref Range: 12 - 32 U/L 18  ALT Latest Ref Range: 5 - 32 U/L 18  Total Protein Latest Ref Range: 6.3 - 8.2 g/dL 6.5  Total Bilirubin Latest Ref Range: 0.2 - 1.1 mg/dL 0.6  GFR, Est Non African American Latest Ref Range: > OR = 60 mL/min/1.64m2 133  GFR, Est African American Latest Ref Range: > OR = 60 mL/min/1.45m2 154  Alkaline phosphatase (APISO) Latest Ref Range: 36 - 128 U/L 70  Globulin Latest Ref Range: 2.0 - 3.8 g/dL (calc) 2.2  WBC Latest Ref Range: 4.5 - 13.0 Thousand/uL 7.9  RBC Latest Ref Range: 3.80 - 5.10 Million/uL 4.73  Hemoglobin Latest Ref Range: 11.5 - 15.3 g/dL 16.1  HCT Latest Ref Range: 34.0 - 46.0 % 38.1  MCV Latest Ref Range: 78.0 - 98.0 fL 80.5  MCH Latest Ref Range: 25.0 - 35.0 pg 26.2  MCHC Latest Ref Range: 31.0 - 36.0 g/dL 09.6  RDW Latest Ref Range: 11.0 - 15.0 % 12.8  Platelets Latest Ref Range: 140 - 400 Thousand/uL 208  MPV Latest Ref Range: 7.5 - 12.5 fL 11.2  Neutrophils Latest Units: % 46.1  Monocytes Relative Latest Units: % 5.7  Eosinophil  Latest Units: % 9.7  Basophil Latest Units: % 1.1  NEUT# Latest Ref Range: 1,800 - 8,000 cells/uL 3,642  Lymphocyte # Latest Ref Range: 1,200 - 5,200 cells/uL 2,955  Total Lymphocyte Latest Units: % 37.4  Eosinophils Absolute Latest Ref Range: 15 - 500 cells/uL 766 (H)  Basophils Absolute Latest Ref Range: 0 - 200 cells/uL 87  Absolute Monocytes Latest Ref Range: 200 - 900 cells/uL 450  TSH Latest Units: mIU/L <0.01 (L)  T4,Free(Direct) Latest Ref Range: 0.8 - 1.4 ng/dL 2.0 (H)  Thyroxine (T4) Latest Ref Range: 5.3 - 11.7 mcg/dL 04.5 (H)  Albumin MSPROF Latest Ref Range: 3.6 - 5.1 g/dL 4.3    Ref. Range 04/20/2020 11:40  Vitamin D, 25-Hydroxy Latest Ref Range: 30 - 100 ng/mL 26 (L)  TSH Latest Units: mIU/L 0.01 (L)  T4,Free(Direct) Latest Ref Range: 0.8 - 1.4 ng/dL 1.1  Thyroxine (T4) Latest Ref Range: 5.3 - 11.7 mcg/dL 7.3    ASSESSMENT/PLAN: Erline Siddoway is a 19 y.o. female with autoimmune acquired hypothyroidism who is clinically euthyroid on levothyroxine treatment. My only concern is that she has had continued weight loss which she attributes to increased activity and sweating.  She reports good appetite and continued eating.  Her thyroid lab trend has been erratic (was requiring larger doses of levothyroxine then had weight loss and significant reduction of thyroid hormone needs; I also questioned in the past whether lab results were accurate as she was taking biotin, which she has since stopped).  Goal of treatment is TSH in the lower half of the normal range with FT4/T4 in the upper half of the normal range.   1. Autoimmune Acquired hypothyroidism -Will draw TSH, FT4, T4 today -Continue current levothyroxine pending labs  2. Vitamin D deficiency -Repeat 25-OH D level today -If supplement still needed, will consider ergocalciferol 50,000 units weekly as she sometimes gets nauseous with daily Vit D pill.   The best way to reach Jacqueline Green for lab results is [phone]  Follow-up:   Return in about 3 months (around 10/20/2020).    >30 minutes spent today reviewing the medical chart, counseling the patient/family, and documenting today's encounter.  [name], MD  --------------------------------  07/21/20 10:29 AM ADDENDUM: TSH continues to be suppressed off biotin, so will stop levothyroxine x 1 month to determine if she actually needs levothyroxine supplementation any longer.  Will draw TSH, FT4, T4 in 1 month (ordered).   Vit D remains low, will change to ergocalciferol 50,000 units weekly x 8 weeks. Rx sent  Will have my nursing staff call her with the message below:  Results for orders placed or performed in visit on 07/20/20  T4  Result Value Ref Range   T4, Total 9.1 5.3 - 11.7 mcg/dL  T4, free  Result Value Ref Range   Free T4 1.6 (H) 0.8 - 1.4 ng/dL  TSH  Result Value Ref Range   TSH 0.01 (L) mIU/L  VITAMIN D 25 Hydroxy (Vit-D Deficiency, Fractures)  Result Value Ref Range   Vit D, 25-Hydroxy 21 (L) 30 - 100 ng/mL    Your thyroid labs show that your dose of thyroid medicine is too high.  I actually question whether you need the thyroid medicine anymore.  Based on your labs, I want you to stop your thyroid medicine for 1 month, then have labs drawn so we can determine if you actually need thyroid medicine.  I put in lab orders at [facility] so in 1 month you can go to any [facility] location (or come to our office any day except Thursday) to have labs drawn.  I will call when these results are back and we can decide at that time whether we need to restart the medicine.    Your vitamin D level is still low at 21 (we want it above 30), so I sent a prescription for high dose vitamin D that you will take one pill per week.  You can stop taking the daily vitamin D.  -------------------------------- 07/26/20 11:17 AM ADDENDUM: Jacqueline Green very worried about how she will feel if stopping levothyroxine completely.  I called and spoke with Jacqueline Green, explained that her  labs show that current dose of levothyroxine is too high.  Will drop her down to half of 25mcg tab (12.505mcg daily) and repeat labs in 1 month.  Will also order thyroid ultrasound given mom's history.  -------------------------------- 08/11/20 10:44 AM ADDENDUM: 08/08/20 CLINICAL DATA:  19 year old female with a history of autoimmune hypothyroidism  EXAM: THYROID ULTRASOUND  TECHNIQUE: Ultrasound examination of the thyroid gland and adjacent soft tissues was performed.  COMPARISON:  None.  FINDINGS: Parenchymal Echotexture: Markedly heterogenous  Isthmus: 0.3 cm  Right lobe: 5.0 cm x 1.5 cm x 1.6 cm  Left lobe: 4.7 cm x 1.5 cm x 1.7 cm  _________________________________________________________  Estimated total number of nodules >/= 1 cm: 0  Number of spongiform nodules >/=  2 cm not described below (TR1): 0  Number of mixed cystic and solid nodules >/= 1.5 cm not described below (TR2): 0  _________________________________________________________  Nodule # 1:  Location: Right; Inferior  Maximum size: 0.9 cm; Other 2 dimensions: 0.8 cm x 0.4 cm  Composition: spongiform (0)  ACR TI-RADS recommendations:  Spongiform nodule does not meet criteria for surveillance or biopsy  _________________________________________________________  No adenopathy  IMPRESSION: Heterogeneous thyroid compatible with medical thyroid disease.  No thyroid nodule meets criteria for biopsy or surveillance, as designated by the newly established ACR TI-RADS criteria.  Recommendations follow those established by the new ACR TI-RADS criteria (J Am Coll Radiol 2017;14:587-595).   Electronically Signed   By: Gilmer MorJaime  Wagner D.O.   On: 08/09/2020 15:32    Called Jacqueline Green to discuss  results.  Ultrasound showed a small benign appearing spongiform nodule.  There is a possibility that this could be an autonomous nodule, so may need to consider an uptake scan in the future  though I want to repeat thyroid labs on lower dose of levothyroxine to see if TSH improves on its own.  Plan to repeat TSH, FT4, T4 on 12.51mcg levothyroxine x 1 month.  [name], MD  -------------------------------- 09/07/20 10:30 AM ADDENDUM: Labs drawn 08/31/2020 by PCP: TSH 0.23 (0.34-4.5) FT4 1.08 (0.61-1.12) FT3 2.72 (2.5-3.9) CMP unremarkable except gluc 55 (likely falsely low due to blood sitting in the tube) CBC unremarkable A1c 5.3%  Called Jacqueline Green to discuss results.  She is still taking levothyroxine 12.2mcg daily. Feeling ok.     She asked about vitamin D level; it does not appear that it was drawn.  She completed her course of ergocalciferol 50,000units/week.  Will send in an additional rx for ergo 50,000units/week x 6 weeks.  Will repeat thyroid labs and 25-OHD level at next visit with me on 10/24/2020.

## 2020-07-20 NOTE — Patient Instructions (Addendum)
It was a pleasure to see you in clinic today.   Feel free to contact our office during normal business hours at [phone] with questions or concerns. If you need [place] urgently after normal business hours, please call the above number to reach our answering service who will contact the on-call pediatric endocrinologist.  If you choose to communicate with [place] via MyChart, please do not send urgent messages as this inbox is NOT monitored on nights or weekends.  Urgent concerns should be discussed with the on-call pediatric endocrinologist.  -Take your thyroid medication at the same time every day -If you forget to take a dose, take it as soon as you remember.  If you don't remember until the next day, take 2 doses then.  NEVER take more than 2 doses at a time. -Use a pill box to help make it easier to keep track of doses   Please go to the following address to have labs drawn after today's visit: [address] [place], [place] [address]

## 2020-07-21 LAB — VITAMIN D 25 HYDROXY (VIT D DEFICIENCY, FRACTURES): Vit D, 25-Hydroxy: 21 ng/mL — ABNORMAL LOW (ref 30–100)

## 2020-07-21 LAB — TSH: TSH: 0.01 mIU/L — ABNORMAL LOW

## 2020-07-21 LAB — T4, FREE: Free T4: 1.6 ng/dL — ABNORMAL HIGH (ref 0.8–1.4)

## 2020-07-21 LAB — T4: T4, Total: 9.1 ug/dL (ref 5.3–11.7)

## 2020-07-21 MED ORDER — ERGOCALCIFEROL 1.25 MG (50000 UT) PO CAPS: 50000.0000 [IU] | ORAL_CAPSULE | ORAL | 0 refills | Status: DC

## 2020-07-21 NOTE — Addendum Note (Signed)
Addended by: Judene Companion on: 07/21/2020 10:32 AM   Modules accepted: Orders

## 2020-07-24 ENCOUNTER — Telehealth (INDEPENDENT_AMBULATORY_CARE_PROVIDER_SITE_OTHER): Payer: Self-pay | Admitting: Pediatrics

## 2020-07-24 DIAGNOSIS — E063 Autoimmune thyroiditis: Secondary | ICD-10-CM

## 2020-07-24 NOTE — Telephone Encounter (Signed)
  Who's calling (name and relationship to patient) : [place] (self)  Best contact number: [phone]  Provider they see: Dr. Larinda Buttery  Reason for call: Patient states that she had a missed call from this office & she thought it might be about her lab results. I did not see anything in the chart - I told patient I would let the clinical staff know that she has called.    PRESCRIPTION REFILL ONLY  Name of prescription:  Pharmacy:

## 2020-07-25 NOTE — Telephone Encounter (Signed)
-----   Message from [name], MD sent at 07/21/2020 10:29 AM EDT ----- Please call Jacqueline Green at [phone] with the following message:  Your thyroid labs show that your dose of thyroid medicine is too high.  I actually question whether you need the thyroid medicine anymore.  Based on your labs, I want you to stop your thyroid medicine for 1 month, then have labs drawn so we can determine if you actually need thyroid medicine.  I put in lab orders at [facility] so in 1 month you can go to any [facility] location (or come to our office any day except Thursday) to have labs drawn.  I will call when these results are back and we can decide at that time whether we need to restart the medicine.    Your vitamin D level is still low at 21 (we want it above 30), so I sent a prescription for high dose vitamin D that you will take one pill per week.  You can stop taking the daily vitamin D.

## 2020-07-25 NOTE — Telephone Encounter (Signed)
Contacted patient and let her know per Dr. Larinda Buttery "Your thyroid labs show that your dose of thyroid medicine is too high.  I actually question whether you need the thyroid medicine anymore.  Based on your labs, I want you to stop your thyroid medicine for 1 month, then have labs drawn so we can determine if you actually need thyroid medicine.  I put in lab orders at [facility] so in 1 month you can go to any [facility] location (or come to our office any day except Thursday) to have labs drawn.  I will call when these results are back and we can decide at that time whether we need to restart the medicine.    Your vitamin D level is still low at 21 (we want it above 30), so I sent a prescription for high dose vitamin D that you will take one pill per week.  You can stop taking the daily vitamin D."   Patient states that she will stop taking her medication, but she is going to feel like "garbage"  States in the past if she has forgotten to take her medications and has "felt like death, is very tired and has no energy."   Informed patient that this message would be passed to the provider to see if she wanted to keep the original plan.

## 2020-07-26 ENCOUNTER — Telehealth (INDEPENDENT_AMBULATORY_CARE_PROVIDER_SITE_OTHER): Payer: Self-pay | Admitting: Pediatrics

## 2020-07-26 NOTE — Telephone Encounter (Signed)
I called [place].  Please see addendum to my most recent clinic note for details.   [name], MD

## 2020-07-26 NOTE — Telephone Encounter (Signed)
  Who's calling (name and relationship to patient) :mom / [name]   Best contact [phone]  Provider they see:Dr. Larinda Buttery   Reason for call:Mom would like a call back to discuss test results that were given to [facility] this morning.      PRESCRIPTION REFILL ONLY  Name of prescription:  Pharmacy:

## 2020-07-26 NOTE — Addendum Note (Signed)
Addended by: Judene Companion on: 07/26/2020 11:20 AM   Modules accepted: Orders

## 2020-07-31 NOTE — Telephone Encounter (Signed)
Mom called back requesting to speak to Dr. Larinda Buttery about these test results and mom states patient doesn't have a prescription for synthroid.

## 2020-08-02 MED ORDER — LEVOTHYROXINE SODIUM 25 MCG PO TABS: 12.5000 ug | ORAL_TABLET | Freq: Every day | ORAL | 3 refills | Status: DC

## 2020-08-02 NOTE — Telephone Encounter (Signed)
Do we have the signed form for me to be able to talk to mom?  Pt is 18.  I can put in the prescription for her and send it.  Thanks

## 2020-08-02 NOTE — Telephone Encounter (Signed)
Sent Rx for pharmacy for levothyroxine 12.27mcg daily.

## 2020-08-02 NOTE — Addendum Note (Signed)
Addended byJudene Companion on: 08/02/2020 09:25 AM   Modules accepted: Orders

## 2020-08-02 NOTE — Telephone Encounter (Signed)
Returned call to mom to discuss lab results, reduction in levothyroxine dose to 12. daily (half of tab), and plan to perform thyroid ultrasound to see if there is an explanation for why she no longer needs as much levothyroxine (rule out autonomously secreting nodule, etc).  Mom had partial thyroidectomy as a young adult and has had problems with her weight ever since.  Mom worried that Jacqueline Green thinks she will have to have her thyroid removed also.  Explained that I am looking with ultrasound to see if there is an explanation for change in labs, I do not anticipate that she will need thyroidectomy. Mom OK with info given.    Jacqueline Needle, MD

## 2020-08-04 ENCOUNTER — Other Ambulatory Visit: Payer: Self-pay

## 2020-08-08 ENCOUNTER — Ambulatory Visit
Admission: RE | Admit: 2020-08-08 | Discharge: 2020-08-08 | Disposition: A | Discharge status: Home / Self Care | Payer: No Typology Code available for payment source | Source: Ambulatory Visit | Attending: Pediatrics | Admitting: Pediatrics

## 2020-08-08 DIAGNOSIS — E063 Autoimmune thyroiditis: Secondary | ICD-10-CM

## 2020-09-07 MED ORDER — ERGOCALCIFEROL 1.25 MG (50000 UT) PO CAPS: 50000.0000 [IU] | ORAL_CAPSULE | ORAL | 0 refills | Status: DC

## 2020-09-07 NOTE — Addendum Note (Signed)
Addended byJudene Companion on: 09/07/2020 10:36 AM   Modules accepted: Orders

## 2020-09-22 ENCOUNTER — Telehealth (INDEPENDENT_AMBULATORY_CARE_PROVIDER_SITE_OTHER): Payer: Self-pay | Admitting: Pediatrics

## 2020-09-22 NOTE — Telephone Encounter (Signed)
  Who's calling (name and relationship to patient) : [place] (self)  Best contact number: [phone]  Provider they see: Dr. Larinda Buttery  Reason for call: Patient states that she just recently saw a gastroenterologist and they have prescribed a medication that she is not comfortable taking without speaking with Dr. Larinda Buttery. Requests call back.    PRESCRIPTION REFILL ONLY  Name of prescription:  Pharmacy:

## 2020-09-22 NOTE — Telephone Encounter (Signed)
Contacted patient and she informs she is prescribed Omeprazole 40MG  QD for frequent nausea and occasional emesis. Patient wanted to ensure this medication was okay to take with her other medications. Informed patient that if she is currently taking this medication she should not take it at the same time as her Levothyroxine she needs to take Levothyroxine first, then 2 hours later take the Omeprazole.   Let patient know that Dr. will not return to the office until Tuesday so this call can be routed to the on call provider. Or it can be routed to Dr. Thursday to respond on Tuesday.  Patient states that she is okay waiting until Tuesday for a response. Will route to Dr. Sunday

## 2020-09-26 ENCOUNTER — Telehealth (INDEPENDENT_AMBULATORY_CARE_PROVIDER_SITE_OTHER): Payer: Self-pay | Admitting: Pediatrics

## 2020-09-26 NOTE — Telephone Encounter (Signed)
It is fine for her to take omeptrazole with levothyroxine, though she should take both at the same time.  Please call to let her know this.   [name], MD

## 2020-09-26 NOTE — Telephone Encounter (Signed)
Who's calling (name and relationship to patient) : Jovanna Snead self   Best contact number: [phone]  Provider they see: Dr Larinda Buttery   Reason for call: Patient called stating that she is having issues swallowing. She went to her PCP and they said that they didn't find anything but she wanted to let Dr. Larinda Buttery know. She is requesting call back to discuss further.   Call ID:      PRESCRIPTION REFILL ONLY  Name of prescription:  Pharmacy:

## 2020-09-27 NOTE — Telephone Encounter (Signed)
Left voicemail for patient to call back. 

## 2020-09-27 NOTE — Telephone Encounter (Signed)
Contacted patient and let her know she should take medications at the same time. See other phone note for further details.

## 2020-09-27 NOTE — Telephone Encounter (Signed)
I reviewed her thyroid ultrasound.  It is unlikely that thyroid nodule/thyroid size is causing difficulty swallowing.  Will advise to let me know if it gets worse; she has an appt with me in about 1 month.    Also recommend she contact PCP for leg complaints.    [name], MD

## 2020-09-27 NOTE — Telephone Encounter (Signed)
Contacted patient and she informs that she is able to swallow, and has not chocked, or gagged on anything. She states it feels as though there is a lump in her throat.   She also informs that this morning she has had pain and tingling in her left leg below the knee. She states it is a 2/10, and does not feel as though it is traveling. 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 She does not report any swelling. When asked if there was any redness, or if it felt hot to the touch she states "not anymore than the other"  She uses a [place] IUD as contraception. Informed patient this information would be passed on to Dr. Larinda Buttery.

## 2020-10-24 ENCOUNTER — Encounter (INDEPENDENT_AMBULATORY_CARE_PROVIDER_SITE_OTHER): Payer: Self-pay

## 2020-10-24 ENCOUNTER — Ambulatory Visit (INDEPENDENT_AMBULATORY_CARE_PROVIDER_SITE_OTHER): Payer: No Typology Code available for payment source | Admitting: Pediatrics

## 2020-10-24 NOTE — Progress Notes (Deleted)
Pediatric Endocrinology Follow-up Visit  Chief Complaint: acquired hypothyroidism  HPI: Jacqueline Green is a 19 y.o. female presenting for follow-up of the above concerns.  she attended this visit*** alone.     1. Jacqueline Green initially presented to [facility] in 06/2015 after her PCP diagnosed primary hypothyroidism.  She had been seen by her PCP on 01/27/15, at which time she complained of weight gain, hair breakage, and fatigue.  Lifestyle modifications were recommended at that time to prevent weight gain.  She went back to her PCP in 03/2015 with similar symptoms (weight gain notably) after making diet changes and blood work was done.  Labs obtained 04/29/2015 showed TSH 124.9, free T4 0.2, hemoglobin A1c 5.6%, CMP normal except ALT slightly elevated at 36 (upper limit of normal 35).  Initial diagnosis of hypothyroidism is consistent with autoimmune hypothyroidism; on 06/07/2015 TSH was 71.542, FT4 0.85, TPO Ab >900, thyroglobulin Ab elevated at 7.  She was started on levothyroxine daily in 2016.  Her dose has been titrated since. She also had a spongiform thyroid nodule on ultrasound.  She was also noted to have a mildly low vitamin D level in the past and has been taking supplementation.  2. Since last visit on 729/21, she has been well.  ***.   Dose of levothyroxine decreased at last visit as TSH was suppressed.  Repeat TSH 1 month later was improved (though still low) at 0.23 with normal FT4.    Thyroid symptoms: Brand name synthroid 12. daily Missed doses: None  Weight changes: *** Energy level: *** Sleep: good*** Appetite *** Constipation/Diarrhea: None*** Difficulty swallowing: no*** Periods regular: IUD, rare spotting Tremor: No*** Palpitations: No***  Vitamin D deficiency: Most recent vitamin D level: 26 in 03/2020 Taking supplementation: taking ergo 50,000 units weekly x 12 weeks Due for repeat Vit D level today  ROS: All systems reviewed with pertinent positives listed below;  otherwise negative. Constitutional: Weight has ***creased ***lb since last visit.       Past Medical History:   Past Medical History:  Diagnosis Date  . Acquired autoimmune hypothyroidism    Dx 04/2015.  TSH 124, FT4 0.2. TPO ab > 900  . Seasonal allergies    takes zyrtec and flonase prn   Meds: Outpatient Encounter Medications as of 10/24/2020  Medication Sig Note  . cetirizine (ZYRTEC) 10 MG tablet Take 10 mg by mouth daily. 04/20/2020: PRN  . cholecalciferol (VITAMIN D3) 25 MCG (1000 UT) tablet Take 1,000 Units by mouth daily.   . ergocalciferol (VITAMIN D2) 1.25 MG (50000 UT) capsule Take 1 capsule (50,000 Units total) by mouth once a week.   . levocetirizine (XYZAL) 5 MG tablet Take 5 mg by mouth every evening. 04/20/2020: PRN switches with zyrtec   . levonorgestrel (KYLEENA) 19.5 MG IUD by Intrauterine route once.   Marland Kitchen levothyroxine (SYNTHROID) 25 MCG tablet Take 0.5 tablets (12.5 mcg total) by mouth daily.   . Melatonin 10 MG TABS Take by mouth. 04/20/2020: Only when she can't sleep   No facility-administered encounter medications on file as of 10/24/2020.    Allergies: Allergies  Allergen Reactions  . Strawberry (Diagnostic) Hives    Surgical History: Past Surgical History:  Procedure Laterality Date  . none      Family History:  Family History  Problem Relation Age of Onset  . Thyroid disease Mother        had benign tumor causing hyperthyroidism in 1991, underwent partial thyroidectomy.  Was on synthroid for years post-op, then around  2010 she was able to stop synthroid    . Hypertension Father   Multiple maternal family members with hypothyroidism including MGM, 3 maternal aunts, and 1 maternal uncle.  Pt has cousin with T1DM, otherwise no other autoimmune diseases in the family  Social History: Lives with: parents (older brother at college) Attends [employer]  Physical Exam:  There were no vitals filed for this visit. There were no vitals taken for this visit. Body  mass index: body mass index is unknown because there is no height or weight on file. Blood pressure percentiles are not available for patients who are 18 years or older.  Wt Readings from Last 3 Encounters:  07/20/20 110 lb 10.1 oz (50.2 kg) (18 %, Z= -0.91)*  04/20/20 120 lb 6.4 oz (54.6 kg) (39 %, Z= -0.27)*  10/21/19 115 lb (52.2 kg) (30 %, Z= -0.52)*   * Growth percentiles are based on CDC (Girls, 2-20 Years) data.   Ht Readings from Last 3 Encounters:  07/20/20 5' 2.64" (1.591 m) (26 %, Z= -0.64)*  04/20/20 5' 2.68" (1.592 m) (27 %, Z= -0.62)*  10/05/19 5' 1.81" (1.57 m) (17 %, Z= -0.95)*   * Growth percentiles are based on CDC (Girls, 2-20 Years) data.   There is no height or weight on file to calculate BMI.  No weight on file for this encounter. No height on file for this encounter.  General: Well developed, well nourished female in no acute distress.  Appears *** stated age Head: Normocephalic, atraumatic.   Eyes:  Pupils equal and round. EOMI.   Sclera white.  No eye drainage.   Ears/Nose/Mouth/Throat: Masked Neck: supple, no cervical lymphadenopathy, no thyromegaly Cardiovascular: regular rate, normal S1/S2, no murmurs Respiratory: No increased work of breathing.  Lungs clear to auscultation bilaterally.  No wheezes. Abdomen: soft, nontender, nondistended.  Extremities: warm, well perfused, cap refill < 2 sec.   Musculoskeletal: Normal muscle mass.  Normal strength Skin: warm, dry.  No rash or lesions. Neurologic: alert and oriented, normal speech, no tremor   Laboratory Evaluation: 06/07/15 TSH 71.5, free T4 0.85 09/22/15 TSH 35.812, FT4 1.02, TPO Ab >900, thyroglobulin Ab 7 (<2) 10/20/15 TSH 6.293, FT4 1.09 12/27/15: TSH 0.103, FT4 2.09 02/07/16: TSH 0.82, FT4 1.6, T4 11.1    Ref. Range 10/21/2019 10:58  Sodium Latest Ref Range: 135 - 146 mmol/L 138  Potassium Latest Ref Range: 3.8 - 5.1 mmol/L 4.2  Chloride Latest Ref Range: 98 - 110 mmol/L 105  CO2 Latest Ref  Range: 20 - 32 mmol/L 26  Glucose Latest Ref Range: 65 - 99 mg/dL 80  BUN Latest Ref Range: 7 - 20 mg/dL 9  Creatinine Latest Ref Range: 0.50 - 1.00 mg/dL 1.610.60  Calcium Latest Ref Range: 8.9 - 10.4 mg/dL 9.5  BUN/Creatinine Ratio Latest Ref Range: 6 - 22 (calc) NOT APPLICABLE  AG Ratio Latest Ref Range: 1.0 - 2.5 (calc) 2.0  AST Latest Ref Range: 12 - 32 U/L 18  ALT Latest Ref Range: 5 - 32 U/L 18  Total Protein Latest Ref Range: 6.3 - 8.2 g/dL 6.5  Total Bilirubin Latest Ref Range: 0.2 - 1.1 mg/dL 0.6  GFR, Est Non African American Latest Ref Range: > OR = 60 mL/min/1.7473m2 133  GFR, Est African American Latest Ref Range: > OR = 60 mL/min/1.6673m2 154  Alkaline phosphatase (APISO) Latest Ref Range: 36 - 128 U/L 70  Globulin Latest Ref Range: 2.0 - 3.8 g/dL (calc) 2.2  WBC Latest Ref Range: 4.5 -  13.0 Thousand/uL 7.9  RBC Latest Ref Range: 3.80 - 5.10 Million/uL 4.73  Hemoglobin Latest Ref Range: 11.5 - 15.3 g/dL 66.5  HCT Latest Ref Range: 34.0 - 46.0 % 38.1  MCV Latest Ref Range: 78.0 - 98.0 fL 80.5  MCH Latest Ref Range: 25.0 - 35.0 pg 26.2  MCHC Latest Ref Range: 31.0 - 36.0 g/dL 99.3  RDW Latest Ref Range: 11.0 - 15.0 % 12.8  Platelets Latest Ref Range: 140 - 400 Thousand/uL 208  MPV Latest Ref Range: 7.5 - 12.5 fL 11.2  Neutrophils Latest Units: % 46.1  Monocytes Relative Latest Units: % 5.7  Eosinophil Latest Units: % 9.7  Basophil Latest Units: % 1.1  NEUT# Latest Ref Range: 1,800 - 8,000 cells/uL 3,642  Lymphocyte # Latest Ref Range: 1,200 - 5,200 cells/uL 2,955  Total Lymphocyte Latest Units: % 37.4  Eosinophils Absolute Latest Ref Range: 15 - 500 cells/uL 766 (H)  Basophils Absolute Latest Ref Range: 0 - 200 cells/uL 87  Absolute Monocytes Latest Ref Range: 200 - 900 cells/uL 450  TSH Latest Units: mIU/L <0.01 (L)  T4,Free(Direct) Latest Ref Range: 0.8 - 1.4 ng/dL 2.0 (H)  Thyroxine (T4) Latest Ref Range: 5.3 - 11.7 mcg/dL 57.0 (H)  Albumin MSPROF Latest Ref Range: 3.6 -  5.1 g/dL 4.3    Ref. Range 04/20/2020 11:40  Vitamin D, 25-Hydroxy Latest Ref Range: 30 - 100 ng/mL 26 (L)  TSH Latest Units: mIU/L 0.01 (L)  T4,Free(Direct) Latest Ref Range: 0.8 - 1.4 ng/dL 1.1  Thyroxine (T4) Latest Ref Range: 5.3 - 11.7 mcg/dL 7.3     Ref. Range 07/20/2020 11:00  Vitamin D, 25-Hydroxy Latest Ref Range: 30 - 100 ng/mL 21 (L)  TSH Latest Units: mIU/L 0.01 (L)  T4,Free(Direct) Latest Ref Range: 0.8 - 1.4 ng/dL 1.6 (H)  Thyroxine (T4) Latest Ref Range: 5.3 - 11.7 mcg/dL 9.1    Labs drawn 12/29/7937 by PCP: TSH 0.23 (0.34-4.5) FT4 1.08 (0.61-1.12) FT3 2.72 (2.5-3.9) CMP unremarkable except gluc 55 (likely falsely low due to blood sitting in the tube) CBC unremarkable A1c 5.3% ------------------------------------------------------------------------------------------- 08/08/20 CLINICAL DATA:  19 year old female with a history of autoimmune hypothyroidism  EXAM: THYROID ULTRASOUND  TECHNIQUE: Ultrasound examination of the thyroid gland and adjacent soft tissues was performed.  COMPARISON:  None.  FINDINGS: Parenchymal Echotexture: Markedly heterogenous  Isthmus: 0.3 cm  Right lobe: 5.0 cm x 1.5 cm x 1.6 cm  Left lobe: 4.7 cm x 1.5 cm x 1.7 cm  _________________________________________________________  Estimated total number of nodules >/= 1 cm: 0  Number of spongiform nodules >/=  2 cm not described below (TR1): 0  Number of mixed cystic and solid nodules >/= 1.5 cm not described below (TR2): 0  _________________________________________________________  Nodule # 1:  Location: Right; Inferior  Maximum size: 0.9 cm; Other 2 dimensions: 0.8 cm x 0.4 cm  Composition: spongiform (0)  ACR TI-RADS recommendations:  Spongiform nodule does not meet criteria for surveillance or biopsy  _________________________________________________________  No adenopathy  IMPRESSION: Heterogeneous thyroid compatible with medical thyroid  disease.  No thyroid nodule meets criteria for biopsy or surveillance, as designated by the newly established ACR TI-RADS criteria.  Recommendations follow those established by the new ACR TI-RADS criteria (J Am Coll Radiol 2017;14:587-595).   Electronically Signed   By: Gilmer Mor D.O.   On: 08/09/2020 15:32   ASSESSMENT/PLAN:*** Jacqueline Green is a 19 y.o. female with autoimmune acquired hypothyroidism who is clinically euthyroid on levothyroxine treatment. My only concern is that she  has had continued weight loss which she attributes to increased activity and sweating.  She reports good appetite and continued eating.  Her thyroid lab trend has been erratic (was requiring larger doses of levothyroxine then had weight loss and significant reduction of thyroid hormone needs; I also questioned in the past whether lab results were accurate as she was taking biotin, which she has since stopped).  Goal of treatment is TSH in the lower half of the normal range with FT4/T4 in the upper half of the normal range.   1. Autoimmune Acquired hypothyroidism -Will draw TSH, FT4, T4 today -Continue current levothyroxine pending labs  2. Vitamin D deficiency -Repeat 25-OH D level today -If supplement still needed, will consider ergocalciferol 50,000 units weekly as she sometimes gets nauseous with daily Vit D pill.   The best way to reach [facility] for lab results is [phone]  Follow-up:  No follow-ups on file.    >30 minutes spent today reviewing the medical chart, counseling the patient/family, and documenting today's encounter.  [name], MD  -------------------------------- 10/24/20 5:36 AM ADDENDUM: TSH continues to be suppressed off biotin, so will stop levothyroxine x 1 month to determine if she actually needs levothyroxine supplementation any longer.  Will draw TSH, FT4, T4 in 1 month (ordered).   Vit D remains low, will change to ergocalciferol 50,000 units weekly x  8 weeks. Rx sent  Will have my nursing staff call her with the message below:  Results for orders placed or performed in visit on 07/20/20  T4  Result Value Ref Range   T4, Total 9.1 5.3 - 11.7 mcg/dL  T4, free  Result Value Ref Range   Free T4 1.6 (H) 0.8 - 1.4 ng/dL  TSH  Result Value Ref Range   TSH 0.01 (L) mIU/L  VITAMIN D 25 Hydroxy (Vit-D Deficiency, Fractures)  Result Value Ref Range   Vit D, 25-Hydroxy 21 (L) 30 - 100 ng/mL

## 2020-12-28 ENCOUNTER — Other Ambulatory Visit: Payer: Self-pay

## 2020-12-28 ENCOUNTER — Encounter (INDEPENDENT_AMBULATORY_CARE_PROVIDER_SITE_OTHER): Payer: Self-pay | Admitting: Pediatrics

## 2020-12-28 ENCOUNTER — Ambulatory Visit (INDEPENDENT_AMBULATORY_CARE_PROVIDER_SITE_OTHER): Payer: No Typology Code available for payment source | Admitting: Pediatrics

## 2020-12-28 VITALS — BP 124/69 | HR 83 | Ht 62.6 in | Wt 123.0 lb

## 2020-12-28 DIAGNOSIS — E559 Vitamin D deficiency, unspecified: Secondary | ICD-10-CM | POA: Diagnosis not present

## 2020-12-28 DIAGNOSIS — E063 Autoimmune thyroiditis: Secondary | ICD-10-CM | POA: Diagnosis not present

## 2020-12-28 NOTE — Progress Notes (Addendum)
Pediatric Endocrinology Follow-up Visit  Chief Complaint: acquired hypothyroidism  HPI: Jacqueline Green is a 20 y.o. female presenting for follow-up of the above concerns.  she attended this visit alone.     1. Jacqueline Green initially presented to [facility] in 06/2015 after her PCP diagnosed primary hypothyroidism.  She had been seen by her PCP on 01/27/15, at which time she complained of weight gain, hair breakage, and fatigue.  Lifestyle modifications were recommended at that time to prevent weight gain.  She went back to her PCP in 03/2015 with similar symptoms (weight gain notably) after making diet changes and blood work was done.  Labs obtained 04/29/2015 showed TSH 124.9, free T4 0.2, hemoglobin A1c 5.6%, CMP normal except ALT slightly elevated at 36 (upper limit of normal 35).  Initial diagnosis of hypothyroidism is consistent with autoimmune hypothyroidism; on 06/07/2015 TSH was 71.542, FT4 0.85, TPO Ab >900, thyroglobulin Ab elevated at 7.  She was started on levothyroxine daily in 2016.  Her dose has been titrated since.  She was also noted to have a mildly low vitamin D level in the past (01/2018-25-OH D level 29); 1000 units vitamin D daily was recommended.    2. Since last visit on 07/20/20, she has been well.  Worried that her neck is swelling (feels like submandibular lymph nodes), worried this may be related to her thyroid nodule.  Provided reassurance that it is likely unrelated.  Thyroid symptoms: Brand name synthroid 12. daily Missed doses: every once in a while  Heat or cold intolerance: neither Weight changes: Weight has increased 13lb since last visit.     Energy level: good Sleep: good, no naps anymore Skin changes: No, face breaking out more but thinks related to period Constipation/Diarrhea: no Difficulty swallowing: none, though worried she will not be able to breathe if her thyroid gets too enlarged Neck swelling: no Periods regular: IUD in place Tremor: no Palpitations:  no  Vitamin D deficiency: Most recent vitamin D level: 26 in 03/2020 Taking supplementation: Completed ergocalciferol 50,000 units per week Due for Vit D level today  ROS: All systems reviewed with pertinent positives listed below; otherwise negative. Worried that glucose on CMP performed by PCP was low at 55.  Explained this was likely due to sitting in the tube too long before it was run.  Will repeat glucose and A1c today.  She reports eating well.  Past Medical History:   Past Medical History:  Diagnosis Date  . Acquired autoimmune hypothyroidism    Dx 04/2015.  TSH 124, FT4 0.2. TPO ab > 900  . Seasonal allergies    takes zyrtec and flonase prn   Meds: Outpatient Encounter Medications as of 12/28/2020  Medication Sig Note  . cetirizine (ZYRTEC) 10 MG tablet Take 10 mg by mouth daily. 04/20/2020: PRN  . ergocalciferol (VITAMIN D2) 1.25 MG (50000 UT) capsule Take 1 capsule (50,000 Units total) by mouth once a week.   . levocetirizine (XYZAL) 5 MG tablet Take 5 mg by mouth every evening. 04/20/2020: PRN switches with zyrtec   . levonorgestrel (KYLEENA) 19.5 MG IUD by Intrauterine route once.   Marland Kitchen levothyroxine (SYNTHROID) 25 MCG tablet Take 0.5 tablets (12.5 mcg total) by mouth daily.   . cholecalciferol (VITAMIN D3) 25 MCG (1000 UT) tablet Take 1,000 Units by mouth daily. (Patient not taking: Reported on 12/28/2020)   . Melatonin 10 MG TABS Take by mouth. (Patient not taking: Reported on 12/28/2020) 04/20/2020: Only when she can't sleep  . omeprazole (PRILOSEC)  20 MG capsule TAKE 1 CAPSULE BY MOUTH TWICE DAILY 30 MINUTES BEFORE A MEAL ON AN EMPTY STOMACH (Patient not taking: Reported on 12/28/2020)    No facility-administered encounter medications on file as of 12/28/2020.    Allergies: Allergies  Allergen Reactions  . Strawberry (Diagnostic) Hives    Surgical History: Past Surgical History:  Procedure Laterality Date  . none      Family History:  Family History  Problem Relation  Age of Onset  . Thyroid disease Mother        had benign tumor causing hyperthyroidism in 1991, underwent partial thyroidectomy.  Was on synthroid for years post-op, then around 2010 she was able to stop synthroid    . Hypertension Father   Multiple maternal family members with hypothyroidism including MGM, 3 maternal aunts, and 1 maternal uncle.  Pt has cousin with T1DM, otherwise no other autoimmune diseases in the family  Social History: Lives with: parents (older brother at college) Attends [employer]  Physical Exam:  Vitals:   12/28/20 0929  BP: 124/69  Pulse: 83  Weight: 123 lb (55.8 kg)  Height: 5' 2.6" (1.59 m)   BP 124/69   Pulse 83   Ht 5' 2.6" (1.59 m)   Wt 123 lb (55.8 kg)   BMI 22.07 kg/m  Body mass index: body mass index is 22.07 kg/m. Blood pressure percentiles are not available for patients who are 18 years or older.  Wt Readings from Last 3 Encounters:  12/28/20 123 lb (55.8 kg) (41 %, Z= -0.21)*  07/20/20 110 lb 10.1 oz (50.2 kg) (18 %, Z= -0.91)*  04/20/20 120 lb 6.4 oz (54.6 kg) (39 %, Z= -0.27)*   * Growth percentiles are based on CDC (Girls, 2-20 Years) data.   Ht Readings from Last 3 Encounters:  12/28/20 5' 2.6" (1.59 m) (25 %, Z= -0.66)*  07/20/20 5' 2.64" (1.591 m) (26 %, Z= -0.64)*  04/20/20 5' 2.68" (1.592 m) (27 %, Z= -0.62)*   * Growth percentiles are based on CDC (Girls, 2-20 Years) data.   Body mass index is 22.07 kg/m.  41 %ile (Z= -0.21) based on CDC (Girls, 2-20 Years) weight-for-age data using vitals from 12/28/2020. 25 %ile (Z= -0.66) based on CDC (Girls, 2-20 Years) Stature-for-age data based on Stature recorded on 12/28/2020.  General: Well developed, well nourished female in no acute distress.  Appears stated age Head: Normocephalic, atraumatic.   Eyes:  Pupils equal and round. EOMI.   Sclera white.  No eye drainage.   Ears/Nose/Mouth/Throat: Masked Neck: supple, mild submandibular lymphadenopathy, no thyromegaly, thyroid palpable with  smooth texture Cardiovascular: regular rate, normal S1/S2, no murmurs Respiratory: No increased work of breathing.  Lungs clear to auscultation bilaterally.  No wheezes. Abdomen: soft, nontender, nondistended.  Extremities: warm, well perfused, cap refill < 2 sec.   Musculoskeletal: Normal muscle mass.  Normal strength Skin: warm, dry.  No rash or lesions. Neurologic: alert and oriented, normal speech, no tremor   Laboratory Evaluation: 06/07/15 TSH 71.5, free T4 0.85 09/22/15 TSH 35.812, FT4 1.02, TPO Ab >900, thyroglobulin Ab 7 (<2) 10/20/15 TSH 6.293, FT4 1.09 12/27/15: TSH 0.103, FT4 2.09 02/07/16: TSH 0.82, FT4 1.6, T4 11.1    Ref. Range 10/21/2019 10:58  Sodium Latest Ref Range: 135 - 146 mmol/L 138  Potassium Latest Ref Range: 3.8 - 5.1 mmol/L 4.2  Chloride Latest Ref Range: 98 - 110 mmol/L 105  CO2 Latest Ref Range: 20 - 32 mmol/L 26  Glucose Latest Ref Range: 65 -  99 mg/dL 80  BUN Latest Ref Range: 7 - 20 mg/dL 9  Creatinine Latest Ref Range: 0.50 - 1.00 mg/dL 4.78  Calcium Latest Ref Range: 8.9 - 10.4 mg/dL 9.5  BUN/Creatinine Ratio Latest Ref Range: 6 - 22 (calc) NOT APPLICABLE  AG Ratio Latest Ref Range: 1.0 - 2.5 (calc) 2.0  AST Latest Ref Range: 12 - 32 U/L 18  ALT Latest Ref Range: 5 - 32 U/L 18  Total Protein Latest Ref Range: 6.3 - 8.2 g/dL 6.5  Total Bilirubin Latest Ref Range: 0.2 - 1.1 mg/dL 0.6  GFR, Est Non African American Latest Ref Range: > OR = 60 mL/min/1.68m2 133  GFR, Est African American Latest Ref Range: > OR = 60 mL/min/1.61m2 154  Alkaline phosphatase (APISO) Latest Ref Range: 36 - 128 U/L 70  Globulin Latest Ref Range: 2.0 - 3.8 g/dL (calc) 2.2  WBC Latest Ref Range: 4.5 - 13.0 Thousand/uL 7.9  RBC Latest Ref Range: 3.80 - 5.10 Million/uL 4.73  Hemoglobin Latest Ref Range: 11.5 - 15.3 g/dL 29.5  HCT Latest Ref Range: 34.0 - 46.0 % 38.1  MCV Latest Ref Range: 78.0 - 98.0 fL 80.5  MCH Latest Ref Range: 25.0 - 35.0 pg 26.2  MCHC Latest Ref Range:  31.0 - 36.0 g/dL 62.1  RDW Latest Ref Range: 11.0 - 15.0 % 12.8  Platelets Latest Ref Range: 140 - 400 Thousand/uL 208  MPV Latest Ref Range: 7.5 - 12.5 fL 11.2  Neutrophils Latest Units: % 46.1  Monocytes Relative Latest Units: % 5.7  Eosinophil Latest Units: % 9.7  Basophil Latest Units: % 1.1  NEUT# Latest Ref Range: 1,800 - 8,000 cells/uL 3,642  Lymphocyte # Latest Ref Range: 1,200 - 5,200 cells/uL 2,955  Total Lymphocyte Latest Units: % 37.4  Eosinophils Absolute Latest Ref Range: 15 - 500 cells/uL 766 (H)  Basophils Absolute Latest Ref Range: 0 - 200 cells/uL 87  Absolute Monocytes Latest Ref Range: 200 - 900 cells/uL 450  TSH Latest Units: mIU/L <0.01 (L)  T4,Free(Direct) Latest Ref Range: 0.8 - 1.4 ng/dL 2.0 (H)  Thyroxine (T4) Latest Ref Range: 5.3 - 11.7 mcg/dL 30.8 (H)  Albumin MSPROF Latest Ref Range: 3.6 - 5.1 g/dL 4.3     Ref. Range 04/20/2020 11:40 07/20/2020 11:00  Vitamin D, 25-Hydroxy Latest Ref Range: 30 - 100 ng/mL 26 (L) 21 (L)  TSH Latest Units: mIU/L 0.01 (L) 0.01 (L)  T4,Free(Direct) Latest Ref Range: 0.8 - 1.4 ng/dL 1.1 1.6 (H)  Thyroxine (T4) Latest Ref Range: 5.3 - 11.7 mcg/dL 7.3 9.1   Labs drawn 05/27/7845 by PCP: TSH 0.23 (0.34-4.5) FT4 1.08 (0.61-1.12) FT3 2.72 (2.5-3.9) CMP unremarkable except gluc 55 CBC unremarkable A1c 5.3%  -------------------------------- 12/28/20 5:26 AM ADDENDUM: 08/08/20 CLINICAL DATA:  20 year old female with a history of autoimmune hypothyroidism  EXAM: THYROID ULTRASOUND  TECHNIQUE: Ultrasound examination of the thyroid gland and adjacent soft tissues was performed.  COMPARISON:  None.  FINDINGS: Parenchymal Echotexture: Markedly heterogenous  Isthmus: 0.3 cm  Right lobe: 5.0 cm x 1.5 cm x 1.6 cm  Left lobe: 4.7 cm x 1.5 cm x 1.7 cm  _________________________________________________________  Estimated total number of nodules >/= 1 cm: 0  Number of spongiform nodules >/=  2 cm not described  below (TR1): 0  Number of mixed cystic and solid nodules >/= 1.5 cm not described below (TR2): 0  _________________________________________________________  Nodule # 1:  Location: Right; Inferior  Maximum size: 0.9 cm; Other 2 dimensions: 0.8 cm x 0.4 cm  Composition: spongiform (0)  ACR TI-RADS recommendations:  Spongiform nodule does not meet criteria for surveillance or biopsy  _________________________________________________________  No adenopathy  IMPRESSION: Heterogeneous thyroid compatible with medical thyroid disease.  No thyroid nodule meets criteria for biopsy or surveillance, as designated by the newly established ACR TI-RADS criteria.  Recommendations follow those established by the new ACR TI-RADS criteria (J Am Coll Radiol 4268;34:196-222).   Electronically Signed   By: Corrie Mckusick D.O.   On: 08/09/2020 15:32    ------------------------------------  ASSESSMENT/PLAN: Jacqueline Green is a 20 y.o. female with autoimmune acquired hypothyroidism who is clinically euthyroid on levothyroxine treatment.  Her thyroid lab trend has been erratic (was requiring larger doses of levothyroxine then had weight loss and significant reduction of thyroid hormone needs; she appears to be doing well on current dosing. Goal of treatment is TSH in the lower half of the normal range with FT4/T4 in the upper half of the normal range. Due for repeat labs today.  Also with vitamin D deficiency and one glucose reading of 55 without symptoms; will repeat glucose and A1c today.   1. Autoimmune Acquired hypothyroidism -Will draw TSH, FT4, T4 today -Continue current levothyroxine pending labs -Reassurance provided regarding lymph nodes due to location rather than thyroid nodule  2. Vitamin D deficiency -Repeat 25-OH D level today  Will also draw A1c and glucose  The best way to reach [facility] for lab results is [phone]  Follow-up:  Return in about 3 months  (around 03/28/2021).   >30 minutes spent today reviewing the medical chart, counseling the patient/family, and documenting today's encounter.   Levon Hedger, MD  -------------------------------- 12/29/20 11:21 AM ADDENDUM: Results for orders placed or performed in visit on 12/28/20  T4, free  Result Value Ref Range   Free T4 1.1 0.8 - 1.4 ng/dL  T4  Result Value Ref Range   T4, Total 6.9 5.3 - 11.7 mcg/dL  TSH  Result Value Ref Range   TSH 0.54 mIU/L  Hemoglobin A1c  Result Value Ref Range   Hgb A1c MFr Bld 4.9 <5.7 % of total Hgb   Mean Plasma Glucose 94 mg/dL   eAG (mmol/L) 5.2 mmol/L  Glucose  Result Value Ref Range   Glucose, Plasma 82 65 - 139 mg/dL  VITAMIN D 25 Hydroxy (Vit-D Deficiency, Fractures)  Result Value Ref Range   Vit D, 25-Hydroxy 29 (L) 30 - 100 ng/mL    Will have nursing staff call Jacqueline Green with the following message: Your thyroid labs are normal!  Please continue your current dose of thyroid medicine.   Your blood sugar level was normal at 82 and your average blood sugar level over 3 months (A1c) was also normal.   Your vitamin D level is improved though is still just below normal (we want it above 30 and yours is at 29).  I want you to take another course of the high dose once-a-week vitamin D.  I sent a prescription to your pharmacy for this.  Please let me know if you have questions!

## 2020-12-28 NOTE — Patient Instructions (Addendum)
It was a pleasure to see you in clinic today.   Feel free to contact our office during normal business hours at [phone] with questions or concerns. If you need [place] urgently after normal business hours, please call the above number to reach our answering service who will contact the on-call pediatric endocrinologist.  If you choose to communicate with [place] via MyChart, please do not send urgent messages as this inbox is NOT monitored on nights or weekends.  Urgent concerns should be discussed with the on-call pediatric endocrinologist.  -Take your thyroid medication at the same time every day -If you forget to take a dose, take it as soon as you remember.  If you don't remember until the next day, take 2 doses then.  NEVER take more than 2 doses at a time. -Use a pill box to help make it easier to keep track of doses    Please go to the following address to have labs drawn after today's visit: [address] [place], [place] [address]

## 2020-12-29 LAB — HEMOGLOBIN A1C
Hgb A1c MFr Bld: 4.9 % of total Hgb (ref <5.7)
Mean Plasma Glucose: 94 mg/dL
eAG (mmol/L): 5.2 mmol/L

## 2020-12-29 LAB — GLUCOSE, RANDOM: Glucose, Plasma: 82 mg/dL (ref 65–139)

## 2020-12-29 LAB — TSH: TSH: 0.54 mIU/L

## 2020-12-29 LAB — VITAMIN D 25 HYDROXY (VIT D DEFICIENCY, FRACTURES): Vit D, 25-Hydroxy: 29 ng/mL — ABNORMAL LOW (ref 30–100)

## 2020-12-29 LAB — T4: T4, Total: 6.9 ug/dL (ref 5.3–11.7)

## 2020-12-29 LAB — T4, FREE: Free T4: 1.1 ng/dL (ref 0.8–1.4)

## 2020-12-29 MED ORDER — ERGOCALCIFEROL 1.25 MG (50000 UT) PO CAPS: 50000.0000 [IU] | ORAL_CAPSULE | ORAL | 0 refills | Status: DC

## 2020-12-29 NOTE — Addendum Note (Signed)
Addended by: Judene Companion on: 12/29/2020 11:22 AM   Modules accepted: Orders

## 2021-01-23 ENCOUNTER — Other Ambulatory Visit (INDEPENDENT_AMBULATORY_CARE_PROVIDER_SITE_OTHER): Payer: Self-pay | Admitting: Pediatrics

## 2021-01-23 DIAGNOSIS — E063 Autoimmune thyroiditis: Secondary | ICD-10-CM

## 2021-03-29 ENCOUNTER — Encounter (INDEPENDENT_AMBULATORY_CARE_PROVIDER_SITE_OTHER): Payer: Self-pay | Admitting: Pediatrics

## 2021-03-29 ENCOUNTER — Other Ambulatory Visit: Payer: Self-pay

## 2021-03-29 ENCOUNTER — Ambulatory Visit (INDEPENDENT_AMBULATORY_CARE_PROVIDER_SITE_OTHER): Payer: No Typology Code available for payment source | Admitting: Pediatrics

## 2021-03-29 VITALS — BP 118/74 | HR 74 | Ht 62.13 in | Wt 128.0 lb

## 2021-03-29 DIAGNOSIS — J302 Other seasonal allergic rhinitis: Secondary | ICD-10-CM | POA: Insufficient documentation

## 2021-03-29 DIAGNOSIS — R1310 Dysphagia, unspecified: Secondary | ICD-10-CM | POA: Insufficient documentation

## 2021-03-29 DIAGNOSIS — E559 Vitamin D deficiency, unspecified: Secondary | ICD-10-CM | POA: Diagnosis not present

## 2021-03-29 DIAGNOSIS — E063 Autoimmune thyroiditis: Secondary | ICD-10-CM

## 2021-03-29 DIAGNOSIS — E039 Hypothyroidism, unspecified: Secondary | ICD-10-CM | POA: Insufficient documentation

## 2021-03-29 DIAGNOSIS — Z8349 Family history of other endocrine, nutritional and metabolic diseases: Secondary | ICD-10-CM | POA: Insufficient documentation

## 2021-03-29 DIAGNOSIS — K5900 Constipation, unspecified: Secondary | ICD-10-CM | POA: Insufficient documentation

## 2021-03-29 DIAGNOSIS — K219 Gastro-esophageal reflux disease without esophagitis: Secondary | ICD-10-CM | POA: Insufficient documentation

## 2021-03-29 NOTE — Patient Instructions (Addendum)
It was a pleasure to see you in clinic today.   Feel free to contact our office during normal business hours at [phone] with questions or concerns. If you need [place] urgently after normal business hours, please call the above number to reach our answering service who will contact the on-call pediatric endocrinologist.  If you choose to communicate with [place] via MyChart, please do not send urgent messages as this inbox is NOT monitored on nights or weekends.  Urgent concerns should be discussed with the on-call pediatric endocrinologist.  -Take your thyroid medication at the same time every day -If you forget to take a dose, take it as soon as you remember.  If you don't remember until the next day, take 2 doses then.  NEVER take more than 2 doses at a time. -Use a pill box to help make it easier to keep track of doses   Continue taking your vitamin D  Please go to the following address to have labs drawn after today's visit: [address] [place], [place] [address]  Or   [address]

## 2021-03-29 NOTE — Progress Notes (Addendum)
Pediatric Endocrinology Follow-up Visit  Chief Complaint: acquired hypothyroidism  HPI: Jacqueline Green is a 20 y.o. female presenting for follow-up of the above concerns.  she attended this visit alone.     1. Jacqueline Green initially presented to [facility] in 06/2015 after her PCP diagnosed primary hypothyroidism.  She had been seen by her PCP on 01/27/15, at which time she complained of weight gain, hair breakage, and fatigue.  Lifestyle modifications were recommended at that time to prevent weight gain.  She went back to her PCP in 03/2015 with similar symptoms (weight gain notably) after making diet changes and blood work was done.  Labs obtained 04/29/2015 showed TSH 124.9, free T4 0.2, hemoglobin A1c 5.6%, CMP normal except ALT slightly elevated at 36 (upper limit of normal 35).  Initial diagnosis of hypothyroidism is consistent with autoimmune hypothyroidism; on 06/07/2015 TSH was 71.542, FT4 0.85, TPO Ab >900, thyroglobulin Ab elevated at 7.  She was started on levothyroxine daily in 2016.  Her dose has been titrated since.  She was also noted to have a mildly low vitamin D level in the past (01/2018-25-OH D level 29); 1000 units vitamin D daily was recommended.    2. Since last visit on 12/28/20, she has been well.  Concerned lymph nodes are enlarged in her neck.  Go up and down in size.  Thyroid symptoms: Brand name synthroid 12. daily Missed doses: occasional missed, none missed in past month  Heat or cold intolerance: neither Weight changes: Weight has increased 5lb since last visit.   Good appetite Energy level: good Sleep: good, occasional naps Constipation/Diarrhea: no Difficulty swallowing: no Neck swelling: no Periods regular: IUD in place, occasional spotting Tremor: no Palpitations: no  Vitamin D deficiency: Most recent vitamin D level: 29 in 12/2020 Taking supplementation: prescribed ergocalciferol 50,000 units per week at last visit but just started taking it again  ROS: All  systems reviewed with pertinent positives listed below; otherwise negative.  Past Medical History:   Past Medical History:  Diagnosis Date  . Acquired autoimmune hypothyroidism    Dx 04/2015.  TSH 124, FT4 0.2. TPO ab > 900  . Seasonal allergies    takes zyrtec and flonase prn   Meds: Outpatient Encounter Medications as of 03/29/2021  Medication Sig Note  . ergocalciferol (VITAMIN D2) 1.25 MG (50000 UT) capsule Take 1 capsule (50,000 Units total) by mouth once a week.   Marland Kitchen levonorgestrel (KYLEENA) 19.5 MG IUD by Intrauterine route once.   Marland Kitchen levothyroxine (SYNTHROID) 25 MCG tablet TAKE 1/2 TABLET(12.5 MCG) BY MOUTH DAILY   . cetirizine (ZYRTEC) 10 MG tablet Take 10 mg by mouth daily. (Patient not taking: Reported on 03/29/2021) 04/20/2020: PRN  . cholecalciferol (VITAMIN D3) 25 MCG (1000 UT) tablet Take 1,000 Units by mouth daily. (Patient not taking: No sig reported)   . levocetirizine (XYZAL) 5 MG tablet Take 5 mg by mouth every evening. (Patient not taking: Reported on 03/29/2021) 04/20/2020: PRN switches with zyrtec   . Melatonin 10 MG TABS Take by mouth. (Patient not taking: No sig reported) 04/20/2020: Only when she can't sleep  . omeprazole (PRILOSEC) 20 MG capsule TAKE 1 CAPSULE BY MOUTH TWICE DAILY 30 MINUTES BEFORE A MEAL ON AN EMPTY STOMACH (Patient not taking: No sig reported)    No facility-administered encounter medications on file as of 03/29/2021.    Allergies: Allergies  Allergen Reactions  . Strawberry (Diagnostic) Hives    Surgical History: Past Surgical History:  Procedure Laterality Date  . none  Family History:  Family History  Problem Relation Age of Onset  . Thyroid disease Mother        had benign tumor causing hyperthyroidism in 1991, underwent partial thyroidectomy.  Was on synthroid for years post-op, then around 2010 she was able to stop synthroid    . Hypertension Father   Multiple maternal family members with hypothyroidism including MGM, 3 maternal  aunts, and 1 maternal uncle.  Pt has cousin with T1DM, otherwise no other autoimmune diseases in the family  Social History: Lives on her own, near parents Works with horses  Physical Exam:  Vitals:   03/29/21 0841  BP: 118/74  Pulse: 74  Weight: 128 lb (58.1 kg)  Height: 5' 2.13" (1.578 m)   BP 118/74 (BP Location: Right Arm, Patient Position: Sitting, Cuff Size: Normal)   Pulse 74   Ht 5' 2.13" (1.578 m)   Wt 128 lb (58.1 kg)   BMI 23.32 kg/m  Body mass index: body mass index is 23.32 kg/m. Blood pressure percentiles are not available for patients who are 18 years or older.  Wt Readings from Last 3 Encounters:  03/29/21 128 lb (58.1 kg) (50 %, Z= 0.01)*  12/28/20 123 lb (55.8 kg) (41 %, Z= -0.21)*  07/20/20 110 lb 10.1 oz (50.2 kg) (18 %, Z= -0.91)*   * Growth percentiles are based on CDC (Girls, 2-20 Years) data.   Ht Readings from Last 3 Encounters:  03/29/21 5' 2.13" (1.578 m) (20 %, Z= -0.85)*  12/28/20 5' 2.6" (1.59 m) (25 %, Z= -0.66)*  07/20/20 5' 2.64" (1.591 m) (26 %, Z= -0.64)*   * Growth percentiles are based on CDC (Girls, 2-20 Years) data.   Body mass index is 23.32 kg/m.  50 %ile (Z= 0.01) based on CDC (Girls, 2-20 Years) weight-for-age data using vitals from 03/29/2021. 20 %ile (Z= -0.85) based on CDC (Girls, 2-20 Years) Stature-for-age data based on Stature recorded on 03/29/2021.  General: Well developed, well nourished female in no acute distress.  Appears stated age Head: Normocephalic, atraumatic.   Eyes:  Pupils equal and round. EOMI.   Sclera white.  No eye drainage.   Ears/Nose/Mouth/Throat: Masked Neck: supple, no cervical lymphadenopathy, no thyromegaly, one palpable submandibular lymph node on L Cardiovascular: regular rate, normal S1/S2, no murmurs Respiratory: No increased work of breathing.  Lungs clear to auscultation bilaterally.  No wheezes. Abdomen: soft, nontender, nondistended.  Extremities: warm, well perfused, cap refill < 2 sec.    Musculoskeletal: Normal muscle mass.  Normal strength Skin: warm, dry.  No rash or lesions. Neurologic: alert and oriented, normal speech, no tremor   Laboratory Evaluation: 06/07/15 TSH 71.5, free T4 0.85 09/22/15 TSH 35.812, FT4 1.02, TPO Ab >900, thyroglobulin Ab 7 (<2) 10/20/15 TSH 6.293, FT4 1.09 12/27/15: TSH 0.103, FT4 2.09 02/07/16: TSH 0.82, FT4 1.6, T4 11.1    Ref. Range 12/28/2020 10:23  Mean Plasma Glucose Latest Units: mg/dL 94  Vitamin D, 93-[id number] Latest Ref Range: 30 - 100 ng/mL 29 (L)  eAG (mmol/L) Latest Units: mmol/L 5.2  Hemoglobin A1C Latest Ref Range: <5.7 % of total Hgb 4.9  Glucose, Plasma Latest Ref Range: 65 - 139 mg/dL 82  TSH Latest Units: mIU/L 0.54  T4,Free(Direct) Latest Ref Range: 0.8 - 1.4 ng/dL 1.1  Thyroxine (T4) Latest Ref Range: 5.3 - 11.7 mcg/dL 6.9    Labs drawn 02/23/75 by PCP: TSH 0.23 (0.34-4.5) FT4 1.08 (0.61-1.12) FT3 2.72 (2.5-3.9) CMP unremarkable except gluc 55 CBC unremarkable A1c 5.3%  --------------------------------  12/28/20 5:26 AM ADDENDUM: 08/08/20 CLINICAL DATA:  20 year old female with a history of autoimmune hypothyroidism  EXAM: THYROID ULTRASOUND  TECHNIQUE: Ultrasound examination of the thyroid gland and adjacent soft tissues was performed.  COMPARISON:  None.  FINDINGS: Parenchymal Echotexture: Markedly heterogenous  Isthmus: 0.3 cm  Right lobe: 5.0 cm x 1.5 cm x 1.6 cm  Left lobe: 4.7 cm x 1.5 cm x 1.7 cm  _________________________________________________________  Estimated total number of nodules >/= 1 cm: 0  Number of spongiform nodules >/=  2 cm not described below (TR1): 0  Number of mixed cystic and solid nodules >/= 1.5 cm not described below (TR2): 0  _________________________________________________________  Nodule # 1:  Location: Right; Inferior  Maximum size: 0.9 cm; Other 2 dimensions: 0.8 cm x 0.4 cm  Composition: spongiform (0)  ACR TI-RADS  recommendations:  Spongiform nodule does not meet criteria for surveillance or biopsy  _________________________________________________________  No adenopathy  IMPRESSION: Heterogeneous thyroid compatible with medical thyroid disease.  No thyroid nodule meets criteria for biopsy or surveillance, as designated by the newly established ACR TI-RADS criteria.  Recommendations follow those established by the new ACR TI-RADS criteria (J Am Coll Radiol 2017;14:587-595).   Electronically Signed   By: Gilmer Mor D.O.   On: 08/09/2020 15:32    ------------------------------------  ASSESSMENT/PLAN: Jamilla Galli is a 20 y.o. female with autoimmune acquired hypothyroidism who is clinically euthyroid on low dose levothyroxine treatment.  Her thyroid lab trend has been erratic (was requiring larger doses of levothyroxine then had weight loss and significant reduction of thyroid hormone needs). Goal of treatment is TSH in the lower half of the normal range with FT4/T4 in the upper half of the normal range. Also with vitamin D deficiency; just started course of ergocalciferol.  1. Autoimmune Acquired hypothyroidism -Will draw TSH, FT4, T4 today -Continue current levothyroxine pending labs -Reassurance provided re: lymph nodes in neck  2. Vitamin D deficiency -Continue ergocalciferol course -Will repeat 25-OHD level at next visit  The best way to reach [facility] for lab results is [phone]  Follow-up:  Return in about 4 months (around 07/29/2021).   >30 minutes spent today reviewing the medical chart, counseling the patient/family, and documenting today's encounter.   [name], MD  -------------------------------- 03/30/21 8:29 AM ADDENDUM: Results for orders placed or performed in visit on 03/29/21  T4, free  Result Value Ref Range   Free T4 1.1 0.8 - 1.4 ng/dL  T4  Result Value Ref Range   T4, Total 6.3 5.3 - 11.7 mcg/dL  TSH  Result Value Ref Range    TSH 0.60 mIU/L  Will have nursing staff contact Jacqueline Green with the following message: Labs look good- please continue current dose of levothyroxine.

## 2021-03-30 LAB — T4: T4, Total: 6.3 ug/dL (ref 5.3–11.7)

## 2021-03-30 LAB — T4, FREE: Free T4: 1.1 ng/dL (ref 0.8–1.4)

## 2021-03-30 LAB — TSH: TSH: 0.6 mIU/L

## 2021-04-02 ENCOUNTER — Encounter (INDEPENDENT_AMBULATORY_CARE_PROVIDER_SITE_OTHER): Payer: Self-pay | Admitting: *Deleted

## 2021-05-18 IMAGING — US US THYROID
1 series · 13 of 25 positions shown · non-contrast
Comparison: None.

CLINICAL DATA: 19-year-old female with a history of autoimmune
hypothyroidism

EXAM:
THYROID ULTRASOUND
TECHNIQUE: Ultrasound examination of the thyroid gland and adjacent soft
tissues was performed.

[Series 1: us thyroid · 0.06mm/px · 13 of 37 slices shown]
[im 1/37]
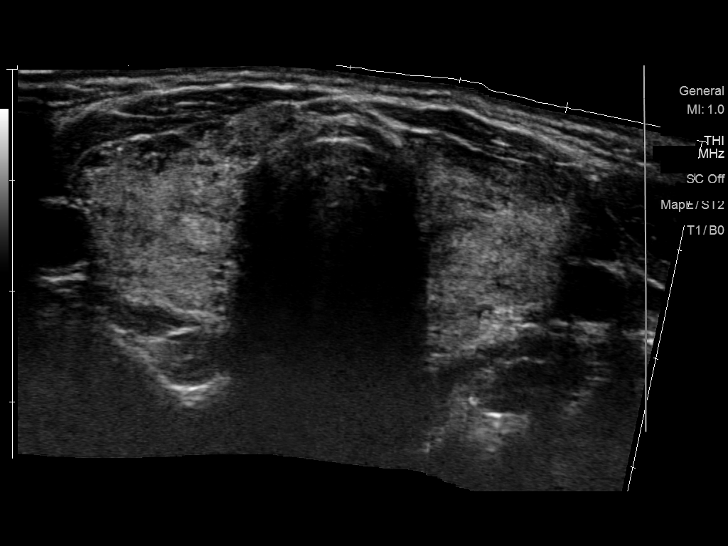
[im 4/37]
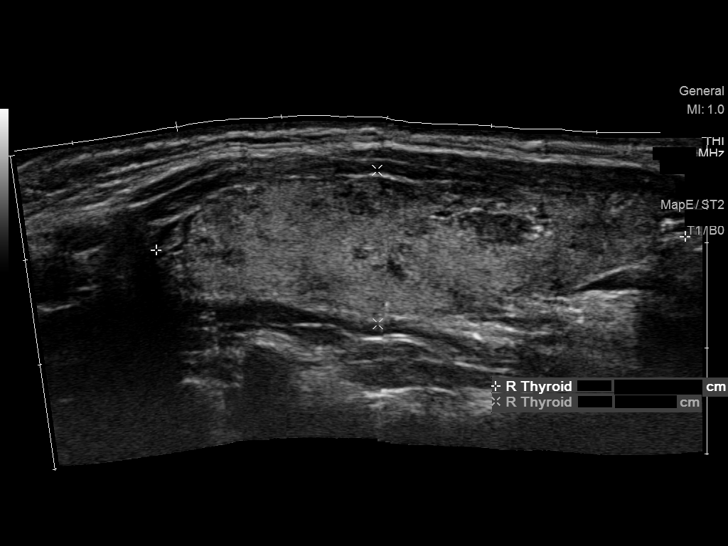
[im 7/37]
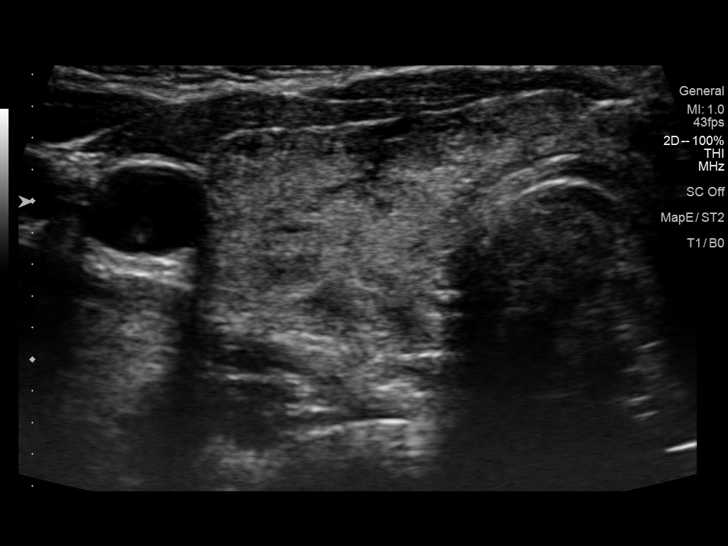
[im 10/37]
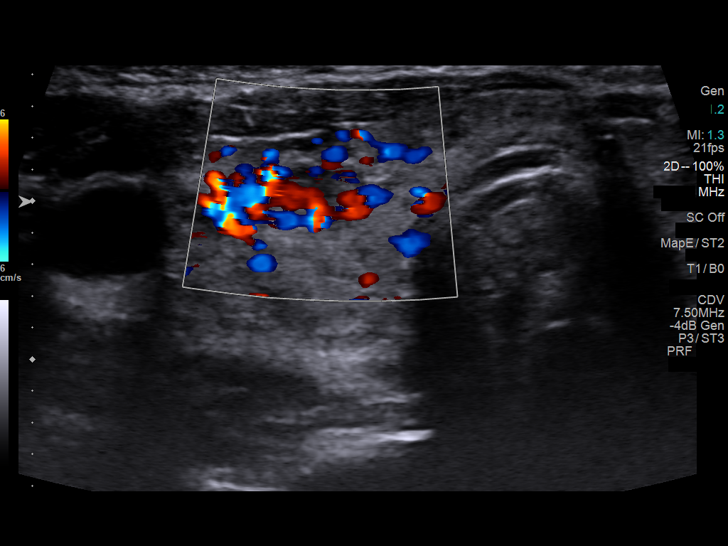
[im 13/37]
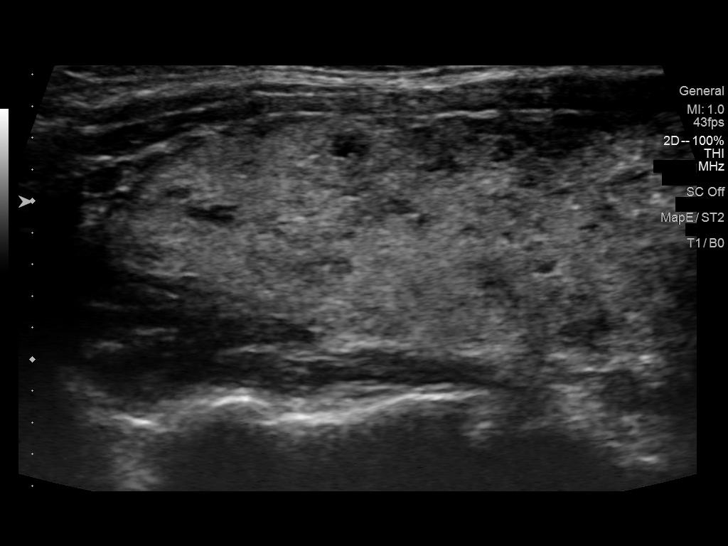
[im 16/37]
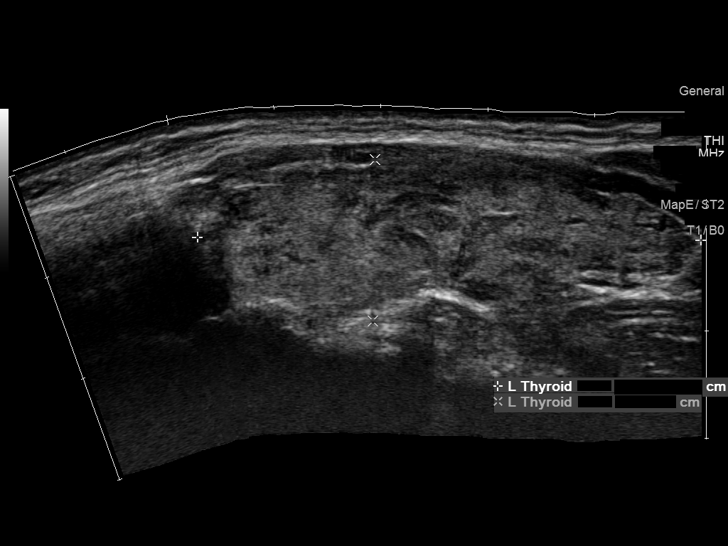
[im 19/37]
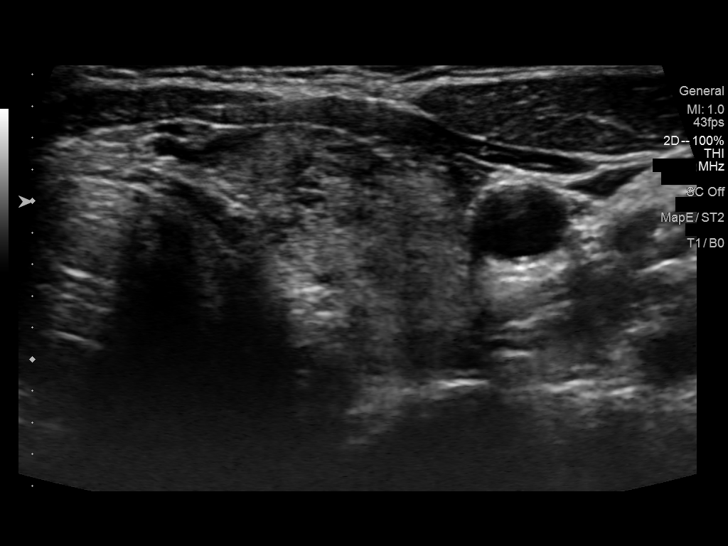
[im 22/37]
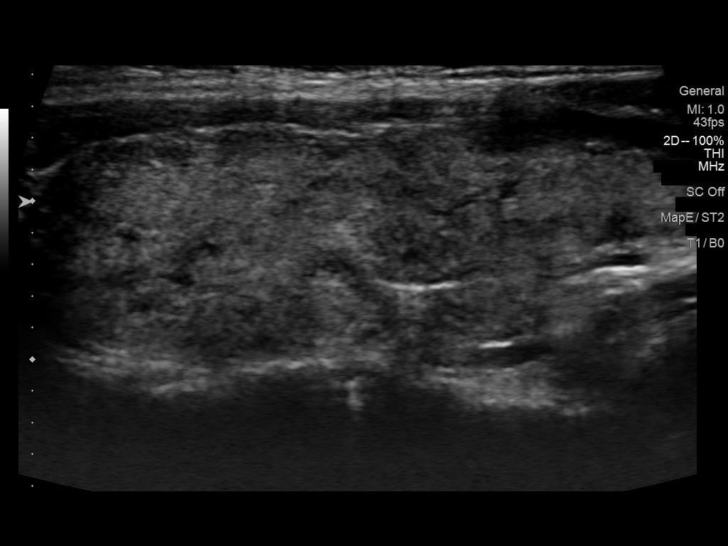
[im 25/37]
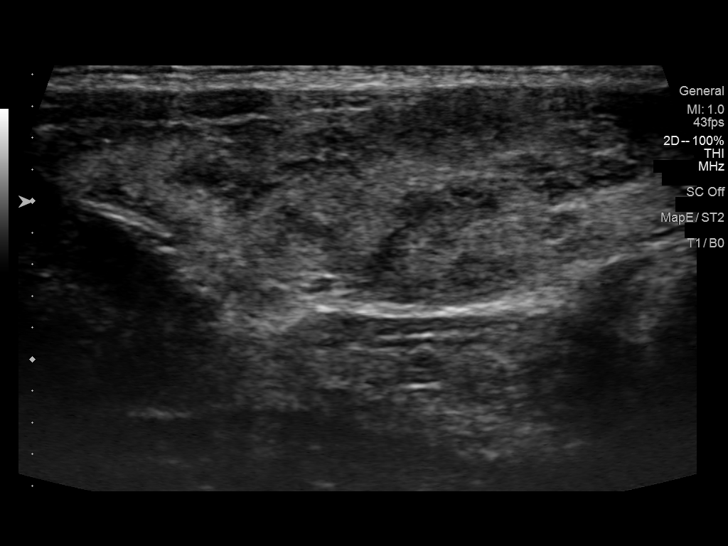
[im 28/37]
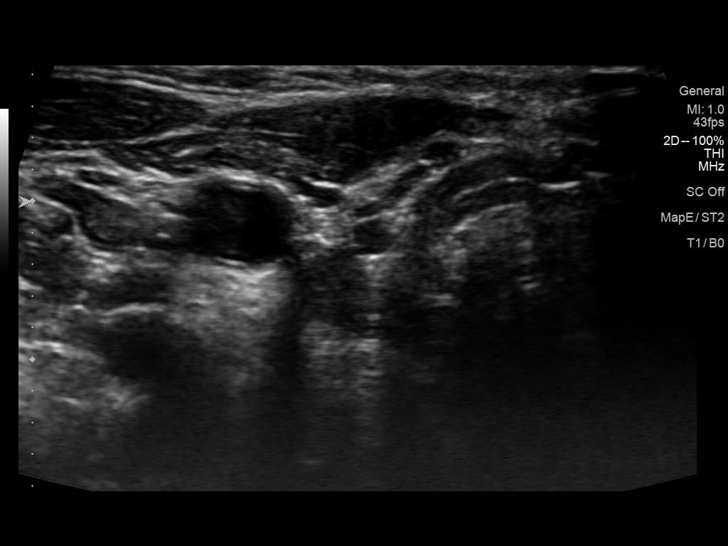
[im 31/37]
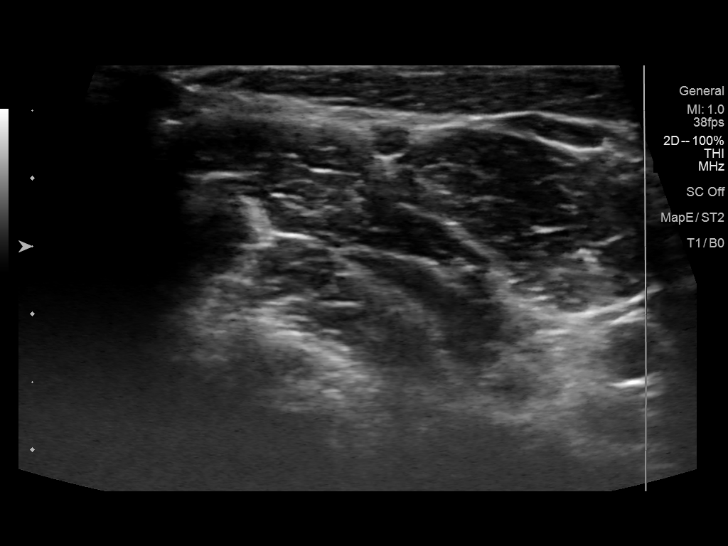
[im 34/37]
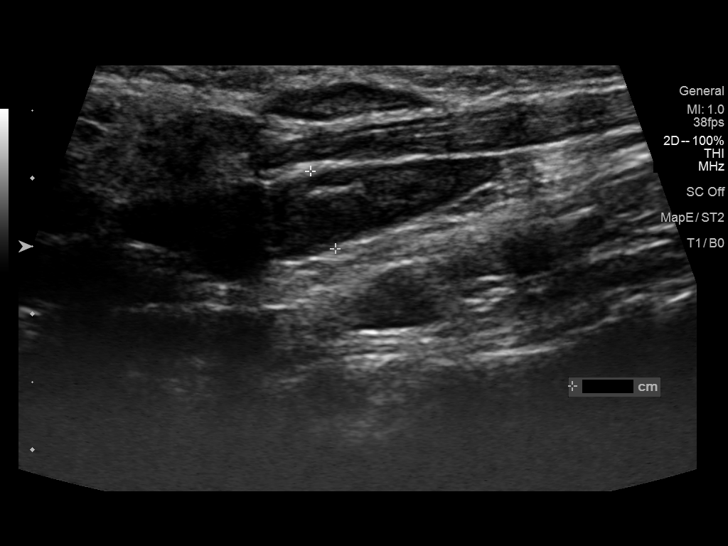
[im 37/37]
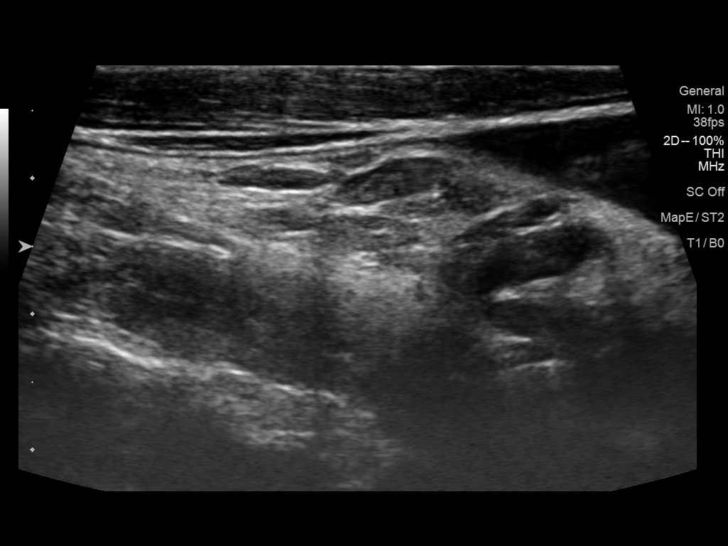

[13 of 25 positions shown; findings below may reference images not displayed]

FINDINGS: Parenchymal Echotexture: Markedly heterogenous

Isthmus: 0.3 cm

Right lobe: 5.0 cm x 1.5 cm x 1.6 cm

Left lobe: 4.7 cm x 1.5 cm x 1.7 cm

_________________________________________________________

Estimated total number of nodules >/= 1 cm: 0

Number of spongiform nodules >/=  2 cm not described below (TR1): 0

Number of mixed cystic and solid nodules >/= 1.5 cm not described
below (TR2): 0

_________________________________________________________

Nodule # 1:

Location: Right; Inferior

Maximum size: 0.9 cm; Other 2 dimensions: 0.8 cm x 0.4 cm

Composition: spongiform (0)

ACR TI-RADS recommendations:

Spongiform nodule does not meet criteria for surveillance or biopsy

_________________________________________________________

No adenopathy
IMPRESSION: Heterogeneous thyroid compatible with medical thyroid disease.

No thyroid nodule meets criteria for biopsy or surveillance, as
designated by the newly established ACR TI-RADS criteria.

Recommendations follow those established by the new ACR TI-RADS
criteria ([HOSPITAL] 1557;[DATE]).

## 2021-08-02 ENCOUNTER — Ambulatory Visit (INDEPENDENT_AMBULATORY_CARE_PROVIDER_SITE_OTHER): Payer: No Typology Code available for payment source | Admitting: Pediatrics

## 2021-08-02 NOTE — Progress Notes (Deleted)
Pediatric Endocrinology Follow-up Visit  Chief Complaint: acquired hypothyroidism  HPI: Jacqueline Green is a 20 y.o. female presenting for follow-up of the above concerns.  she attended this visit alone.   ***  1. Jacqueline Green initially presented to [facility] in 06/2015 after her PCP diagnosed primary hypothyroidism.  She had been seen by her PCP on 01/27/15, at which time she complained of weight gain, hair breakage, and fatigue.  Lifestyle modifications were recommended at that time to prevent weight gain.  She went back to her PCP in 03/2015 with similar symptoms (weight gain notably) after making diet changes and blood work was done.  Labs obtained 04/29/2015 showed TSH 124.9, free T4 0.2, hemoglobin A1c 5.6%, CMP normal except ALT slightly elevated at 36 (upper limit of normal 35).  Initial diagnosis of hypothyroidism is consistent with autoimmune hypothyroidism; on 06/07/2015 TSH was 71.542, FT4 0.85, TPO Ab >900, thyroglobulin Ab elevated at 7.  She was started on levothyroxine daily in 2016.  Her dose has been titrated since.  She was also noted to have a mildly low vitamin D level in the past (01/2018-25-OH D level 29); 1000 units vitamin D daily was recommended.    2. Since last visit on 03/29/21, she has been well.  ***  Thyroid symptoms: Brand name synthroid 12. daily*** Missed doses: ***  Heat or cold intolerance: *** Weight changes: Weight has ***creased ***lb since last visit.  Energy level: *** Sleep: *** Skin changes: *** Constipation/Diarrhea: *** Difficulty swallowing: *** Neck swelling: *** Periods regular: Has IUD Tremor: *** Palpitations: ***   Vitamin D deficiency: Most recent vitamin D level: 29 in 12/2020 Taking supplementation: prescribed ergocalciferol 50,000 units per week   ROS: All systems reviewed with pertinent positives listed below; otherwise negative.     Past Medical History:   Past Medical History:  Diagnosis Date   Acquired autoimmune hypothyroidism     Dx 04/2015.  TSH 124, FT4 0.2. TPO ab > 900   Seasonal allergies    takes zyrtec and flonase prn   Meds: Outpatient Encounter Medications as of 08/02/2021  Medication Sig Note   cetirizine (ZYRTEC) 10 MG tablet Take 10 mg by mouth daily. (Patient not taking: Reported on 03/29/2021) 04/20/2020: PRN   cholecalciferol (VITAMIN D3) 25 MCG (1000 UT) tablet Take 1,000 Units by mouth daily. (Patient not taking: No sig reported)    ergocalciferol (VITAMIN D2) 1.25 MG (50000 UT) capsule Take 1 capsule (50,000 Units total) by mouth once a week.    levocetirizine (XYZAL) 5 MG tablet Take 5 mg by mouth every evening. (Patient not taking: Reported on 03/29/2021) 04/20/2020: PRN switches with zyrtec    levonorgestrel (KYLEENA) 19.5 MG IUD by Intrauterine route once.    levothyroxine (SYNTHROID) 25 MCG tablet TAKE 1/2 TABLET(12.5 MCG) BY MOUTH DAILY    Melatonin 10 MG TABS Take by mouth. (Patient not taking: No sig reported) 04/20/2020: Only when she can't sleep   omeprazole (PRILOSEC) 20 MG capsule TAKE 1 CAPSULE BY MOUTH TWICE DAILY 30 MINUTES BEFORE A MEAL ON AN EMPTY STOMACH (Patient not taking: No sig reported)    No facility-administered encounter medications on file as of 08/02/2021.    Allergies: Allergies  Allergen Reactions   Strawberry (Diagnostic) Hives    Surgical History: Past Surgical History:  Procedure Laterality Date   none      Family History:  Family History  Problem Relation Age of Onset   Thyroid disease Mother        had  benign tumor causing hyperthyroidism in 1991, underwent partial thyroidectomy.  Was on synthroid for years post-op, then around 2010 she was able to stop synthroid     Hypertension Father   Multiple maternal family members with hypothyroidism including MGM, 3 maternal aunts, and 1 maternal uncle.  Pt has cousin with T1DM, otherwise no other autoimmune diseases in the family  Social History: Lives on her own, near parents Works with horses  Physical Exam:   There were no vitals filed for this visit.  There were no vitals taken for this visit. Body mass index: body mass index is unknown because there is no height or weight on file. Blood pressure percentiles are not available for patients who are 18 years or older.  Wt Readings from Last 3 Encounters:  03/29/21 128 lb (58.1 kg) (50 %, Z= 0.01)*  12/28/20 123 lb (55.8 kg) (41 %, Z= -0.21)*  07/20/20 110 lb 10.1 oz (50.2 kg) (18 %, Z= -0.91)*   * Growth percentiles are based on CDC (Girls, 2-20 Years) data.   Ht Readings from Last 3 Encounters:  03/29/21 5' 2.13" (1.578 m) (20 %, Z= -0.85)*  12/28/20 5' 2.6" (1.59 m) (25 %, Z= -0.66)*  07/20/20 5' 2.64" (1.591 m) (26 %, Z= -0.64)*   * Growth percentiles are based on CDC (Girls, 2-20 Years) data.   There is no height or weight on file to calculate BMI.  No weight on file for this encounter. No height on file for this encounter.  General: Well developed, well nourished ***female in no acute distress.  Appears *** stated age Head: Normocephalic, atraumatic.   Eyes:  Pupils equal and round. EOMI.   Sclera white.  No eye drainage.   Ears/Nose/Mouth/Throat: Masked Neck: supple, no cervical lymphadenopathy, no thyromegaly Cardiovascular: regular rate, normal S1/S2, no murmurs Respiratory: No increased work of breathing.  Lungs clear to auscultation bilaterally.  No wheezes. Abdomen: soft, nontender, nondistended.  Extremities: warm, well perfused, cap refill < 2 sec.   Musculoskeletal: Normal muscle mass.  Normal strength Skin: warm, dry.  No rash or lesions. Neurologic: alert and oriented, normal speech, no tremor   Laboratory Evaluation: 06/07/15 TSH 71.5, free T4 0.85 09/22/15 TSH 35.812, FT4 1.02, TPO Ab >900, thyroglobulin Ab 7 (<2) 10/20/15 TSH 6.293, FT4 1.09 12/27/15: TSH 0.103, FT4 2.09 02/07/16: TSH 0.82, FT4 1.6, T4 11.1    Ref. Range 12/28/2020 10:23  Mean Plasma Glucose Latest Units: mg/dL 94  Vitamin D, 34-HDQQIWL Latest  Ref Range: 30 - 100 ng/mL 29 (L)  eAG (mmol/L) Latest Units: mmol/L 5.2  Hemoglobin A1C Latest Ref Range: <5.7 % of total Hgb 4.9  Glucose, Plasma Latest Ref Range: 65 - 139 mg/dL 82  TSH Latest Units: mIU/L 0.54  T4,Free(Direct) Latest Ref Range: 0.8 - 1.4 ng/dL 1.1  Thyroxine (T4) Latest Ref Range: 5.3 - 11.7 mcg/dL 6.9    Labs drawn 06/30/8920 by PCP: TSH 0.23 (0.34-4.5) FT4 1.08 (0.61-1.12) FT3 2.72 (2.5-3.9) CMP unremarkable except gluc 55 CBC unremarkable A1c 5.3%  -------------------------------- 12/28/20 5:26 AM ADDENDUM: 08/08/20 CLINICAL DATA:  20 year old female with a history of autoimmune hypothyroidism   EXAM: THYROID ULTRASOUND   TECHNIQUE: Ultrasound examination of the thyroid gland and adjacent soft tissues was performed.   COMPARISON:  None.   FINDINGS: Parenchymal Echotexture: Markedly heterogenous   Isthmus: 0.3 cm   Right lobe: 5.0 cm x 1.5 cm x 1.6 cm   Left lobe: 4.7 cm x 1.5 cm x 1.7 cm   _________________________________________________________  Estimated total number of nodules >/= 1 cm: 0   Number of spongiform nodules >/=  2 cm not described below (TR1): 0   Number of mixed cystic and solid nodules >/= 1.5 cm not described below (TR2): 0   _________________________________________________________   Nodule # 1:   Location: Right; Inferior   Maximum size: 0.9 cm; Other 2 dimensions: 0.8 cm x 0.4 cm   Composition: spongiform (0)   ACR TI-RADS recommendations:   Spongiform nodule does not meet criteria for surveillance or biopsy   _________________________________________________________   No adenopathy   IMPRESSION: Heterogeneous thyroid compatible with medical thyroid disease.   No thyroid nodule meets criteria for biopsy or surveillance, as designated by the newly established ACR TI-RADS criteria.   Recommendations follow those established by the new ACR TI-RADS criteria (J Am Coll Radiol 2017;14:587-595).      Electronically Signed   By: Gilmer Mor D.O.   On: 08/09/2020 15:32    ------------------------------------  ASSESSMENT/PLAN:*** Jacqueline Green is a 20 y.o. female with autoimmune acquired hypothyroidism who is clinically euthyroid on low dose levothyroxine treatment.  Her thyroid lab trend has been erratic (was requiring larger doses of levothyroxine then had weight loss and significant reduction of thyroid hormone needs). Goal of treatment is TSH in the lower half of the normal range with FT4/T4 in the upper half of the normal range. Also with vitamin D deficiency; just started course of ergocalciferol.  Autoimmune Acquired hypothyroidism -Will draw TSH, FT4, T4 today -Continue current levothyroxine pending labs -Reassurance provided re: lymph nodes in neck  2. Vitamin D deficiency -Continue ergocalciferol course -Will repeat 25-OHD level at next visit  The best way to reach [facility] for lab results is [phone]  Follow-up:  No follow-ups on file.  ***   Jacqueline Needle, MD

## 2021-09-12 ENCOUNTER — Ambulatory Visit (INDEPENDENT_AMBULATORY_CARE_PROVIDER_SITE_OTHER): Payer: No Typology Code available for payment source | Admitting: Pediatrics

## 2021-09-12 NOTE — Progress Notes (Deleted)
Pediatric Endocrinology Follow-up Visit  Chief Complaint: acquired hypothyroidism  HPI: Jacqueline Green is a 20 y.o. female presenting for follow-up of the above concerns.  she attended this visit ***alone.     1. Jacqueline Green initially presented to [facility] in 06/2015 after her PCP diagnosed primary hypothyroidism.  She had been seen by her PCP on 01/27/15, at which time she complained of weight gain, hair breakage, and fatigue.  Lifestyle modifications were recommended at that time to prevent weight gain.  She went back to her PCP in 03/2015 with similar symptoms (weight gain notably) after making diet changes and blood work was done.  Labs obtained 04/29/2015 showed TSH 124.9, free T4 0.2, hemoglobin A1c 5.6%, CMP normal except ALT slightly elevated at 36 (upper limit of normal 35).  Initial diagnosis of hypothyroidism is consistent with autoimmune hypothyroidism; on 06/07/2015 TSH was 71.542, FT4 0.85, TPO Ab >900, thyroglobulin Ab elevated at 7.  She was started on levothyroxine daily in 2016.  Her dose has been titrated since.  She was also noted to have a mildly low vitamin D level in the past (01/2018-25-OH D level 29); 1000 units vitamin D daily was recommended.    2. Since last visit on 03/29/21, she has been well.  ***  Thyroid symptoms: Brand name synthroid 12. daily Missed doses: ***  Heat or cold intolerance: *** Weight changes: Weight has ***creased ***lb since last visit.  Energy level: *** Sleep: *** Skin changes: *** Constipation/Diarrhea: *** Difficulty swallowing: *** Neck swelling: *** Periods regular: IUD in place Tremor: *** Palpitations: ***   Vitamin D deficiency: Most recent vitamin D level: 29 in 12/2020 Taking supplementation: prescribed ergocalciferol 50,000 units per week***  ROS: All systems reviewed with pertinent positives listed below; otherwise negative.      Past Medical History:   Past Medical History:  Diagnosis Date   Acquired autoimmune hypothyroidism     Dx 04/2015.  TSH 124, FT4 0.2. TPO ab > 900   Seasonal allergies    takes zyrtec and flonase prn   Meds: Outpatient Encounter Medications as of 09/12/2021  Medication Sig Note   cetirizine (ZYRTEC) 10 MG tablet Take 10 mg by mouth daily. (Patient not taking: Reported on 03/29/2021) 04/20/2020: PRN   cholecalciferol (VITAMIN D3) 25 MCG (1000 UT) tablet Take 1,000 Units by mouth daily. (Patient not taking: No sig reported)    ergocalciferol (VITAMIN D2) 1.25 MG (50000 UT) capsule Take 1 capsule (50,000 Units total) by mouth once a week.    levocetirizine (XYZAL) 5 MG tablet Take 5 mg by mouth every evening. (Patient not taking: Reported on 03/29/2021) 04/20/2020: PRN switches with zyrtec    levonorgestrel (KYLEENA) 19.5 MG IUD by Intrauterine route once.    levothyroxine (SYNTHROID) 25 MCG tablet TAKE 1/2 TABLET(12.5 MCG) BY MOUTH DAILY    Melatonin 10 MG TABS Take by mouth. (Patient not taking: No sig reported) 04/20/2020: Only when she can't sleep   omeprazole (PRILOSEC) 20 MG capsule TAKE 1 CAPSULE BY MOUTH TWICE DAILY 30 MINUTES BEFORE A MEAL ON AN EMPTY STOMACH (Patient not taking: No sig reported)    No facility-administered encounter medications on file as of 09/12/2021.    Allergies: Allergies  Allergen Reactions   Strawberry (Diagnostic) Hives    Surgical History: Past Surgical History:  Procedure Laterality Date   none      Family History:  Family History  Problem Relation Age of Onset   Thyroid disease Mother  had benign tumor causing hyperthyroidism in 1991, underwent partial thyroidectomy.  Was on synthroid for years post-op, then around 2010 she was able to stop synthroid     Hypertension Father   Multiple maternal family members with hypothyroidism including MGM, 3 maternal aunts, and 1 maternal uncle.  Pt has cousin with T1DM, otherwise no other autoimmune diseases in the family  Social History: Lives on her own, near parents Works with horses  Physical Exam:   There were no vitals filed for this visit.  There were no vitals taken for this visit. Body mass index: body mass index is unknown because there is no height or weight on file. Growth percentile SmartLinks can only be used for patients less than 1 years old.  Wt Readings from Last 3 Encounters:  03/29/21 128 lb (58.1 kg) (50 %, Z= 0.01)*  12/28/20 123 lb (55.8 kg) (41 %, Z= -0.21)*  07/20/20 110 lb 10.1 oz (50.2 kg) (18 %, Z= -0.91)*   * Growth percentiles are based on CDC (Girls, 2-20 Years) data.   Ht Readings from Last 3 Encounters:  03/29/21 5' 2.13" (1.578 m) (20 %, Z= -0.85)*  12/28/20 5' 2.6" (1.59 m) (25 %, Z= -0.66)*  07/20/20 5' 2.64" (1.591 m) (26 %, Z= -0.64)*   * Growth percentiles are based on CDC (Girls, 2-20 Years) data.   There is no height or weight on file to calculate BMI.  Facility age limit for growth percentiles is 20 years. Facility age limit for growth percentiles is 20 years.  General: Well developed, well nourished ***female in no acute distress.  Appears *** stated age Head: Normocephalic, atraumatic.   Eyes:  Pupils equal and round. EOMI.   Sclera white.  No eye drainage.   Ears/Nose/Mouth/Throat: Masked Neck: supple, no cervical lymphadenopathy, no thyromegaly Cardiovascular: regular rate, normal S1/S2, no murmurs Respiratory: No increased work of breathing.  Lungs clear to auscultation bilaterally.  No wheezes. Abdomen: soft, nontender, nondistended.  Extremities: warm, well perfused, cap refill < 2 sec.   Musculoskeletal: Normal muscle mass.  Normal strength Skin: warm, dry.  No rash or lesions. Neurologic: alert and oriented, normal speech, no tremor   Laboratory Evaluation: 06/07/15 TSH 71.5, free T4 0.85 09/22/15 TSH 35.812, FT4 1.02, TPO Ab >900, thyroglobulin Ab 7 (<2) 10/20/15 TSH 6.293, FT4 1.09 12/27/15: TSH 0.103, FT4 2.09 02/07/16: TSH 0.82, FT4 1.6, T4 11.1  Labs drawn 08/31/2020 by PCP: TSH 0.23 (0.34-4.5) FT4 1.08  (0.61-1.12) FT3 2.72 (2.5-3.9) CMP unremarkable except gluc 55 CBC unremarkable A1c 5.3%   Ref. Range 03/29/2021 09:12  TSH Latest Units: mIU/L 0.60  T4,Free(Direct) Latest Ref Range: 0.8 - 1.4 ng/dL 1.1  Thyroxine (T4) Latest Ref Range: 5.3 - 11.7 mcg/dL 6.3   -------------------------------- 12/28/20 5:26 AM ADDENDUM: 08/08/20 CLINICAL DATA:  20 year old female with a history of autoimmune hypothyroidism   EXAM: THYROID ULTRASOUND   TECHNIQUE: Ultrasound examination of the thyroid gland and adjacent soft tissues was performed.   COMPARISON:  None.   FINDINGS: Parenchymal Echotexture: Markedly heterogenous   Isthmus: 0.3 cm   Right lobe: 5.0 cm x 1.5 cm x 1.6 cm   Left lobe: 4.7 cm x 1.5 cm x 1.7 cm   _________________________________________________________   Estimated total number of nodules >/= 1 cm: 0   Number of spongiform nodules >/=  2 cm not described below (TR1): 0   Number of mixed cystic and solid nodules >/= 1.5 cm not described below (TR2): 0   _________________________________________________________   Nodule #  1:   Location: Right; Inferior   Maximum size: 0.9 cm; Other 2 dimensions: 0.8 cm x 0.4 cm   Composition: spongiform (0)   ACR TI-RADS recommendations:   Spongiform nodule does not meet criteria for surveillance or biopsy   _________________________________________________________   No adenopathy   IMPRESSION: Heterogeneous thyroid compatible with medical thyroid disease.   No thyroid nodule meets criteria for biopsy or surveillance, as designated by the newly established ACR TI-RADS criteria.   Recommendations follow those established by the new ACR TI-RADS criteria (J Am Coll Radiol 2017;14:587-595).     Electronically Signed   By: Gilmer Mor D.O.   On: 08/09/2020 15:32    ------------------------------------  ASSESSMENT/PLAN: Jacqueline Green is a 20 y.o. female with autoimmune acquired hypothyroidism who is clinically  euthyroid on low dose brand name synthroid.  Her thyroid lab trend has been erratic (was requiring larger doses of levothyroxine then had weight loss and significant reduction of thyroid hormone needs). Goal of treatment is TSH in the lower half of the normal range with FT4/T4 in the upper half of the normal range. Also with vitamin D deficiency; *** ergocalciferol.  Autoimmune Acquired hypothyroidism -Will draw TSH, FT4, T4 today -Continue current levothyroxine pending labs ***  2. Vitamin D deficiency -Will repeat 25-OHD level today  The best way to reach [facility] for lab results is [phone]  Follow-up:  No follow-ups on file.   ***   [name], MD

## 2022-02-05 ENCOUNTER — Other Ambulatory Visit (INDEPENDENT_AMBULATORY_CARE_PROVIDER_SITE_OTHER): Payer: Self-pay | Admitting: Pediatrics

## 2022-02-05 DIAGNOSIS — E063 Autoimmune thyroiditis: Secondary | ICD-10-CM

## 2022-04-22 ENCOUNTER — Other Ambulatory Visit: Payer: Self-pay | Admitting: Gastroenterology

## 2022-04-22 ENCOUNTER — Ambulatory Visit
Admission: RE | Admit: 2022-04-22 | Discharge: 2022-04-22 | Disposition: A | Discharge status: Home / Self Care | Payer: No Typology Code available for payment source | Source: Ambulatory Visit | Attending: Gastroenterology | Admitting: Gastroenterology

## 2022-04-22 DIAGNOSIS — K5900 Constipation, unspecified: Secondary | ICD-10-CM

## 2022-05-03 ENCOUNTER — Other Ambulatory Visit: Payer: Self-pay

## 2022-05-03 ENCOUNTER — Emergency Department (HOSPITAL_BASED_OUTPATIENT_CLINIC_OR_DEPARTMENT_OTHER)
Admission: EM | Admit: 2022-05-03 | Discharge: 2022-05-03 | Disposition: A | Discharge status: Home / Self Care | Payer: No Typology Code available for payment source | Attending: Emergency Medicine | Admitting: Emergency Medicine

## 2022-05-03 ENCOUNTER — Encounter (HOSPITAL_BASED_OUTPATIENT_CLINIC_OR_DEPARTMENT_OTHER): Payer: Self-pay | Admitting: Emergency Medicine

## 2022-05-03 DIAGNOSIS — Z79899 Other long term (current) drug therapy: Secondary | ICD-10-CM | POA: Insufficient documentation

## 2022-05-03 DIAGNOSIS — R197 Diarrhea, unspecified: Secondary | ICD-10-CM | POA: Diagnosis present

## 2022-05-03 DIAGNOSIS — E039 Hypothyroidism, unspecified: Secondary | ICD-10-CM | POA: Insufficient documentation

## 2022-05-03 LAB — URINALYSIS, ROUTINE W REFLEX MICROSCOPIC
Bilirubin Urine: NEGATIVE
Glucose, UA: NEGATIVE mg/dL
Ketones, ur: NEGATIVE mg/dL
Leukocytes,Ua: NEGATIVE
Nitrite: NEGATIVE
Protein, ur: NEGATIVE mg/dL
Specific Gravity, Urine: >=1.03 (ref 1.005–1.030)
pH: 6 (ref 5.0–8.0)

## 2022-05-03 LAB — PREGNANCY, URINE: Preg Test, Ur: NEGATIVE

## 2022-05-03 LAB — URINALYSIS, MICROSCOPIC (REFLEX): WBC, UA: NONE SEEN WBC/hpf (ref 0–5)

## 2022-05-03 NOTE — ED Provider Notes (Signed)
?[facility] EMERGENCY DEPARTMENT ?Provider Note ? ? ?CSN: [id number] ?Arrival date & time: 05/03/22  1951 ? ?  ? ?History ? ?Chief Complaint  ?Patient presents with  ? Abdominal Pain  ? ? ?Jacqueline Green is a 21 y.o. female.  Patient presents to the hospital with a complaint of frequent bowel movements and painful bowel movements.  The patient states that she was seen by gastroenterology recently and was diagnosed with a small stool burden.  She was started on MiraLAX and was recommended to take MiraLAX for 2 weeks.  The patient states that she has taken it for a week and a half and since starting to take it she has had nothing but diarrhea.  She states she has multiple times per day and feels that it has "worn her out".  She denies any abdominal pain at this time but endorses painful bowel movements because of the frequency.  Past medical history significant for hypothyroidism, history of constipation, GERD ? ?HPI ? ?  ? ?Home Medications ?Prior to Admission medications   ?Medication Sig Start Date End Date Taking? Authorizing Provider  ?cetirizine (ZYRTEC) 10 MG tablet Take 10 mg by mouth daily. ?Patient not taking: Reported on 03/29/2021    [provider]  ?ergocalciferol (VITAMIN D2) 1.25 MG (50000 UT) capsule Take 1 capsule (50,000 Units total) by mouth once a week. 12/29/20   [name], MD  ?levonorgestrel [facility]) 19.5 MG IUD by Intrauterine route once.    [provider]  ?levothyroxine (SYNTHROID) 25 MCG tablet TAKE 1/2 TABLET(12.5 MCG) BY MOUTH DAILY 02/05/22   [name], MD  ?Melatonin 10 MG TABS Take by mouth. ?Patient not taking: No sig reported    [provider]  ?omeprazole (PRILOSEC) 20 MG capsule TAKE 1 CAPSULE BY MOUTH TWICE DAILY 30 MINUTES BEFORE A MEAL ON AN EMPTY STOMACH ?Patient not taking: No sig reported 09/26/20   [provider]  ?   ? ?Allergies    ?Strawberry (diagnostic)   ? ?Review of Systems   ?Review of Systems   ?Constitutional:  Negative for fever.  ?Gastrointestinal:  Positive for diarrhea and rectal pain. Negative for abdominal pain, anal bleeding and constipation.  ? ?Physical Exam ?Updated Vital Signs ?BP 116/82   Pulse 98   Temp 98.2 ?F (36.8 ?C) (Oral)   Resp 16   Ht 5' (1.524 m)   Wt 52.2 kg   SpO2 100%   BMI 22.46 kg/m?  ?Physical Exam ?Vitals and nursing note reviewed.  ?Constitutional:   ?   General: She is not in acute distress. ?   Appearance: She is normal weight.  ?HENT:  ?   Head: Normocephalic and atraumatic.  ?Cardiovascular:  ?   Rate and Rhythm: Normal rate and regular rhythm.  ?   Heart sounds: Normal heart sounds.  ?Pulmonary:  ?   Effort: Pulmonary effort is normal.  ?   Breath sounds: Normal breath sounds.  ?Abdominal:  ?   General: Abdomen is flat. Bowel sounds are increased.  ?   Palpations: Abdomen is soft.  ?   Tenderness: There is no abdominal tenderness.  ?Genitourinary: ?   Comments: Deferred ?Skin: ?   General: Skin is warm and dry.  ?Neurological:  ?   Mental Status: She is alert.  ? ? ?ED Results / Procedures / Treatments   ?Labs ?(all labs ordered are listed, but only abnormal results are displayed) ?Labs Reviewed  ?URINALYSIS, ROUTINE W REFLEX MICROSCOPIC - Abnormal; Notable  for the following components:  ?    Result Value  ? APPearance HAZY (*)   ? Hgb urine dipstick TRACE (*)   ? All other components within normal limits  ?URINALYSIS, MICROSCOPIC (REFLEX) - Abnormal; Notable for the following components:  ? Bacteria, UA RARE (*)   ? All other components within normal limits  ?PREGNANCY, URINE  ? ? ?EKG ?None ? ?Radiology ?No results found. ? ?Procedures ?Procedures  ? ?Medications Ordered in ED ?Medications - No data to display ? ?ED Course/ Medical Decision Making/ A&P ?  ?                        ?Medical Decision Making ?Amount and/or Complexity of Data Reviewed ?Labs: ordered. ? ? ?The patient presents to the hospital with a chief complaint of diarrhea.  Differential includes  medication induced, gastroenteritis, diverticulitis, and others ? ?Based on the patient's recent history of MiraLAX usage, this seems like the most likely culprit for the frequent diarrhea.  She has no tenderness, no other abdominal symptoms at this time.  I see no reason for further work-up in an emergent setting.  I considered imaging but the patient has no pain and this would be unlikely to provide any additional information.  Imaging was also recently done at gastroenterology.  I also considered labs.  I ordered urinalysis and pregnancy test.  Urinalysis shows rare bacteria, not concerning for UTI at this time.  Negative pregnancy test. ? ?I believe the patient may discharge home and stop using the MiraLAX.  This is half a week earlier than GI recommended, but I believe it is very reasonable.  I would recommend follow-up with GI if symptoms persist.  I have discussed this plan with the patient the patient agrees with the plan.  Discharged home ? ? ?Final Clinical Impression(s) / ED Diagnoses ?Final diagnoses:  ?Diarrhea, unspecified type  ? ? ?Rx / DC Orders ?ED Discharge Orders   ? ? None  ? ?  ? ? ?  ?Darrick Grinder, PA-C ?05/03/22 2210 ? ?  ?Maia Plan, MD ?05/08/22 1546 ? ?

## 2022-05-03 NOTE — ED Triage Notes (Signed)
Pt c/o abd pain. Pt has been taking miralax x 1 week. Pt states last BM x 2 days ago. Pt also c/o swollen lymph nodes in neck.  ?

## 2022-05-03 NOTE — Discharge Instructions (Signed)
You were seen today for diarrhea and painful defecation.  As discussed, based on the symptoms starting alongside the starting of the MiraLAX, this is likely the cause.  I recommend stopping the MiraLAX at this time.  Recommend follow-up with your GI doctor to discuss your current symptoms and discuss any potential further work-up. ?

## 2022-05-16 ENCOUNTER — Ambulatory Visit (INDEPENDENT_AMBULATORY_CARE_PROVIDER_SITE_OTHER): Payer: No Typology Code available for payment source | Admitting: Pediatrics

## 2022-05-16 ENCOUNTER — Encounter (INDEPENDENT_AMBULATORY_CARE_PROVIDER_SITE_OTHER): Payer: Self-pay | Admitting: Pediatrics

## 2022-05-16 VITALS — BP 112/64 | HR 86 | Wt 112.0 lb

## 2022-05-16 DIAGNOSIS — E063 Autoimmune thyroiditis: Secondary | ICD-10-CM

## 2022-05-16 DIAGNOSIS — R634 Abnormal weight loss: Secondary | ICD-10-CM | POA: Diagnosis not present

## 2022-05-16 DIAGNOSIS — E559 Vitamin D deficiency, unspecified: Secondary | ICD-10-CM | POA: Diagnosis not present

## 2022-05-16 NOTE — Patient Instructions (Addendum)
It was a pleasure to see you in clinic today.   Feel free to contact our office during normal business hours at [phone] with questions or concerns. If you have an emergency after normal business hours, please call the above number to reach our answering service who will contact the on-call pediatric endocrinologist.  If you choose to communicate with us via MyChart, please do not send urgent messages as this inbox is NOT monitored on nights or weekends.  Urgent concerns should be discussed with the on-call pediatric endocrinologist.  Please go to the following address to have labs drawn after today's visit: [address] [place], [place] [address]  

## 2022-05-16 NOTE — Progress Notes (Addendum)
Pediatric Endocrinology Follow-up Visit  Chief Complaint: acquired hypothyroidism  HPI: Jacqueline Green is a 21 y.o. female presenting for follow-up of the above concerns.  she attended this visit alone.     1. Jacqueline Green initially presented to [facility] in 06/2015 after her PCP diagnosed primary hypothyroidism.  She had been seen by her PCP on 01/27/15, at which time she complained of weight gain, hair breakage, and fatigue.  Lifestyle modifications were recommended at that time to prevent weight gain.  She went back to her PCP in 03/2015 with similar symptoms (weight gain notably) after making diet changes and blood work was done.  Labs obtained 04/29/2015 showed TSH 124.9, free T4 0.2, hemoglobin A1c 5.6%, CMP normal except ALT slightly elevated at 36 (upper limit of normal 35).  Initial diagnosis of hypothyroidism is consistent with autoimmune hypothyroidism; on 06/07/2015 TSH was 71.542, FT4 0.85, TPO Ab >900, thyroglobulin Ab elevated at 7.  She was started on levothyroxine daily in 2016.  Her dose has been titrated since.  She was also noted to have a mildly low vitamin D level in the past (01/2018-25-OH D level 29); 1000 units vitamin D daily was recommended.    2. Since last visit on 03/29/21, she has been OK.  -Started having abd pain around 02/2022, around time she broke up with boyfriend (he's described as an alcoholic/drug addict), he has been stressing her out.  Has seen GI, diagnosed with anal fissures and hemmorhoids, will perform stool studies then imaging.  Never had grossly bloody stools.   Goes to gym 3-4 times per week.  No change in diet.  Still drinking many sugary sodas.  Not much of an appetite, gets full very easily.  Admits to smoking marijuana sometimes to increase appetite.  No drinking alcohol or tobacco use.  She reports her parents are concerned she is taking drugs for weight loss, but she denies this.  Wants to do testing to prove to her parents that she is not taking pills. Parents are  reportedly aware that she uses marijuana.  Thyroid symptoms: Brand name synthroid 12. daily Missed doses: Did not take for 1 week due to vomiting, though feels better since started taking them again.  Heat or cold intolerance: neither Weight changes: Weight has decreased 16lb since last visit.  Energy level: pretty good.   Sleep: good, "like a rock".  No naps Skin changes: little bumps on skin.  Has been laying out at the pool as she feels better outside than laying around inside Constipation/Diarrhea: GI as above.  Told she had constipation due to stool burden in colon.  Denies straining to stool Difficulty swallowing: No Neck swelling: lymph nodes feel enlarged in neck, then smaller Periods regular: IUD in place, occasional spotting Tremor: No Palpitations: no  Taking omeprazole.  Occasional substernal pain.    Vitamin D deficiency: Hx of Vit D defic Taking supplementation: taking 50,000 units weekly  ROS: All systems reviewed with pertinent positives listed below; otherwise negative.  Poor appetite as above No dysuria  Past Medical History:   Past Medical History:  Diagnosis Date   Acquired autoimmune hypothyroidism    Dx 04/2015.  TSH 124, FT4 0.2. TPO ab > 900   Seasonal allergies    takes zyrtec and flonase prn   Meds: Outpatient Encounter Medications as of 05/16/2022  Medication Sig Note   levonorgestrel (KYLEENA) 19.5 MG IUD by Intrauterine route once.    levothyroxine (SYNTHROID) 25 MCG tablet TAKE 1/2 TABLET(12.5 MCG) BY MOUTH  DAILY    cetirizine (ZYRTEC) 10 MG tablet Take 10 mg by mouth daily. (Patient not taking: Reported on 03/29/2021) 04/20/2020: PRN   ergocalciferol (VITAMIN D2) 1.25 MG (50000 UT) capsule Take 1 capsule (50,000 Units total) by mouth once a week. (Patient not taking: Reported on 05/16/2022)    Melatonin 10 MG TABS Take by mouth. (Patient not taking: Reported on 12/28/2020) 04/20/2020: Only when she can't sleep   omeprazole (PRILOSEC) 20 MG  capsule TAKE 1 CAPSULE BY MOUTH TWICE DAILY 30 MINUTES BEFORE A MEAL ON AN EMPTY STOMACH (Patient not taking: Reported on 12/28/2020)    No facility-administered encounter medications on file as of 05/16/2022.    Allergies: Allergies  Allergen Reactions   Strawberry (Diagnostic) Hives    Surgical History: Past Surgical History:  Procedure Laterality Date   none      Family History:  Family History  Problem Relation Age of Onset   Thyroid disease Mother        had benign tumor causing hyperthyroidism in 1991, underwent partial thyroidectomy.  Was on synthroid for years post-op, then around 2010 she was able to stop synthroid     Hypertension Father   Multiple maternal family members with hypothyroidism including MGM, 3 maternal aunts, and 1 maternal uncle.  Pt has cousin with T1DM, otherwise no other autoimmune diseases in the family  Social History: Lives on her own, near parents Works with horses.  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 Growth percentile SmartLinks can only be used for patients less than 21 years old.  Wt Readings from Last 3 Encounters:  05/16/22 112 lb (50.8 kg)  05/03/22 115 lb (52.2 kg)  03/29/21 128 lb (58.1 kg) (50 %, Z= 0.01)*   * Growth percentiles are based on CDC (Girls, 2-20 Years) data.   Ht Readings from Last 3 Encounters:  05/03/22 5' (1.524 m)  03/29/21 5' 2.13" (1.578 m) (20 %, Z= -0.85)*  12/28/20 5' 2.6" (1.59 m) (25 %, Z= -0.66)*   * Growth percentiles are based on CDC (Girls, 2-20 Years) data.   Body mass index is 21.87 kg/m.  Facility age limit for growth percentiles is 20 years. Facility age limit for growth percentiles is 20  years.  General: Well developed, thin female in no acute distress.  Appears stated age Head: Normocephalic, atraumatic.   Eyes:  Pupils equal and round. EOMI.   Sclera white.  No eye drainage.   Ears/Nose/Mouth/Throat: Nares patent, no nasal drainage.  Moist mucous membranes, normal dentition Neck: supple, no cervical lymphadenopathy, no thyromegaly.  Shoddy lymphadenopathy in neck Cardiovascular: regular rate, normal S1/S2, no murmurs Respiratory: No increased work of breathing.  Lungs clear to auscultation bilaterally.  No wheezes. Abdomen: soft, nontender, nondistended.  Extremities: warm, well perfused, cap refill < 2 sec.   Musculoskeletal: Normal muscle mass.  Normal strength Skin: warm, dry.  No rash or lesions. Neurologic: alert and oriented, normal speech, no tremor   Laboratory Evaluation: 06/07/15 TSH 71.5, free T4 0.85 09/22/15 TSH 35.812, FT4 1.02, TPO Ab >900, thyroglobulin Ab 7 (<2) 10/20/15 TSH 6.293, FT4 1.09 12/27/15: TSH 0.103, FT4 2.09 02/07/16:  TSH 0.82, FT4 1.6, T4 11.1    Ref. Range 12/28/2020 10:23  Mean Plasma Glucose Latest Units: mg/dL 94  Vitamin D, 71-IWPYKDX Latest Ref Range: 30 - 100 ng/mL 29 (L)  eAG (mmol/L) Latest Units: mmol/L 5.2  Hemoglobin A1C Latest Ref Range: <5.7 % of total Hgb 4.9  Glucose, Plasma Latest Ref Range: 65 - 139 mg/dL 82  TSH Latest Units: mIU/L 0.54  T4,Free(Direct) Latest Ref Range: 0.8 - 1.4 ng/dL 1.1  Thyroxine (T4) Latest Ref Range: 5.3 - 11.7 mcg/dL 6.9    Labs drawn 07/25/3824 by PCP: TSH 0.23 (0.34-4.5) FT4 1.08 (0.61-1.12) FT3 2.72 (2.5-3.9) CMP unremarkable except gluc 55 CBC unremarkable A1c 5.3%  --------------------------------  08/08/20 CLINICAL DATA:  20 year old female with a history of autoimmune hypothyroidism   EXAM: THYROID ULTRASOUND   TECHNIQUE: Ultrasound examination of the thyroid gland and adjacent soft tissues was performed.   COMPARISON:  None.   FINDINGS: Parenchymal Echotexture: Markedly  heterogenous   Isthmus: 0.3 cm   Right lobe: 5.0 cm x 1.5 cm x 1.6 cm   Left lobe: 4.7 cm x 1.5 cm x 1.7 cm   _________________________________________________________   Estimated total number of nodules >/= 1 cm: 0   Number of spongiform nodules >/=  2 cm not described below (TR1): 0   Number of mixed cystic and solid nodules >/= 1.5 cm not described below (TR2): 0   _________________________________________________________   Nodule # 1:   Location: Right; Inferior   Maximum size: 0.9 cm; Other 2 dimensions: 0.8 cm x 0.4 cm   Composition: spongiform (0)   ACR TI-RADS recommendations:   Spongiform nodule does not meet criteria for surveillance or biopsy   _________________________________________________________   No adenopathy   IMPRESSION: Heterogeneous thyroid compatible with medical thyroid disease.   No thyroid nodule meets criteria for biopsy or surveillance, as designated by the newly established ACR TI-RADS criteria.   Recommendations follow those established by the new ACR TI-RADS criteria (J Am Coll Radiol 2017;14:587-595).     Electronically Signed   By: Gilmer Mor D.O.   On: 08/09/2020 15:32    ------------------------------------  ASSESSMENT/PLAN: Korryn Pancoast is a 21 y.o. female with autoimmune acquired hypothyroidism who is clinically euthyroid on low dose levothyroxine treatment, except for significant weight loss/appetite suppression (followed by GI).  Her thyroid lab trend has been erratic (was requiring larger doses of levothyroxine then had weight loss and significant reduction of thyroid hormone needs). Goal of treatment is TSH in the lower half of the normal range with FT4/T4 in the upper half of the normal range. Also with vitamin D deficiency treated with ergocalciferol; will repeat level today.  Given weight loss, will perform additional lab evaluation.  Autoimmune Acquired hypothyroidism -Will draw TSH, FT4 today -Continue current  levothyroxine pending labs  2. Vitamin D deficiency -Continue ergocalciferol course -Will repeat 25-OHD level today  3. Loss of Weight -Will draw CBC, ACTH/Cortisol to evaluate for adrenal insufficiency, A1c for diabetes given weight loss.  Will send drug screen as well.  The best way to reach [facility] for lab results is [phone]  Follow-up:  Return in about 3 months (around 08/16/2022).   >40 minutes spent today reviewing the medical chart, counseling the patient/family, and documenting today's encounter.   [name], MD  -------------------------------- 05/24/22 2:42 PM ADDENDUM: Results for orders placed or performed in visit on 05/16/22  T4, free  Result Value Ref Range   Free T4 1.1 0.8 - 1.4 ng/dL  TSH  Result Value Ref Range   TSH 0.95 mIU/L  Cortisol  Result Value Ref Range   Cortisol, Plasma 17.8 mcg/dL  ACTH  Result Value Ref Range   C206 ACTH <5 (L) 6 - 50 pg/mL  VITAMIN D 25 Hydroxy (Vit-D Deficiency, Fractures)  Result Value Ref Range   Vit D, 25-Hydroxy 25 (L) 30 - 100 ng/mL  Hemoglobin A1c  Result Value Ref Range   Hgb A1c MFr Bld 4.9 <5.7 % of total Hgb   Mean Plasma Glucose 94 mg/dL   eAG (mmol/L) 5.2 mmol/L  CBC with Differential/Platelet  Result Value Ref Range   WBC 9.0 3.8 - 10.8 Thousand/uL   RBC 4.39 3.80 - 5.10 Million/uL   Hemoglobin 12.6 11.7 - 15.5 g/dL   HCT 16.1 09.6 - 04.5 %   MCV 86.6 80.0 - 100.0 fL   MCH 28.7 27.0 - 33.0 pg   MCHC 33.2 32.0 - 36.0 g/dL   RDW 40.9 81.1 - 91.4 %   Platelets 210 140 - 400 Thousand/uL   MPV 11.0 7.5 - 12.5 fL   Neutro Abs 5,274 1,500 - 7,800 cells/uL   Lymphs Abs 2,862 850 - 3,900 cells/uL   Absolute Monocytes 594 200 - 950 cells/uL   Eosinophils Absolute 198 15 - 500 cells/uL   Basophils Absolute 72 0 - 200 cells/uL   Neutrophils Relative % 58.6 %   Total Lymphocyte 31.8 %   Monocytes Relative 6.6 %   Eosinophils Relative 2.2 %   Basophils Relative 0.8 %  Drug Abuse Panel 8,  with Confirmation, Serum  Result Value Ref Range   Marijuana POSITIVE (A)    DELTA 9 THC 18 ng/mL   DELTA 9 THC CARBOXY ACID 100 ng/mL   Amphetamines negative    Barbiturates negative    Benzodiazepines negative    Cocaine Metabolites negative    Opiates negative    Methadone negative    PCP (PHENCYCLIDINE) negative    COMMENT SEE NOTE    Will have nursing staff call Nikitia with the following message: Hi Velma, Your thyroid labs are normal.  Please continue your current dose of levothyroxine 12.87mcg daily. Your vit D level is just below normal.  I want you to keep taking the once weekly vitamin D supplement.   You are making enough cortisol, your blood counts are normal, and your A1c (average blood sugar over 3 months) is normal. I cannot find a reason for you to be losing weight. Your drug screen was positive only for marijuana.   Please let me know if you have questions! Dr. Larinda Buttery  Rx sent for levothyroxine and ergocalciferol.

## 2022-05-24 LAB — CBC WITH DIFFERENTIAL/PLATELET
Absolute Monocytes: 594 cells/uL (ref 200–950)
Basophils Absolute: 72 cells/uL (ref 0–200)
Basophils Relative: 0.8 %
Eosinophils Absolute: 198 cells/uL (ref 15–500)
Eosinophils Relative: 2.2 %
HCT: 38 % (ref 35.0–45.0)
Hemoglobin: 12.6 g/dL (ref 11.7–15.5)
Lymphs Abs: 2862 cells/uL (ref 850–3900)
MCH: 28.7 pg (ref 27.0–33.0)
MCHC: 33.2 g/dL (ref 32.0–36.0)
MCV: 86.6 fL (ref 80.0–100.0)
MPV: 11 fL (ref 7.5–12.5)
Monocytes Relative: 6.6 %
Neutro Abs: 5274 cells/uL (ref 1500–7800)
Neutrophils Relative %: 58.6 %
Platelets: 210 10*3/uL (ref 140–400)
RBC: 4.39 10*6/uL (ref 3.80–5.10)
RDW: 12.5 % (ref 11.0–15.0)
Total Lymphocyte: 31.8 %
WBC: 9 10*3/uL (ref 3.8–10.8)

## 2022-05-24 LAB — HEMOGLOBIN A1C
Hgb A1c MFr Bld: 4.9 % of total Hgb (ref <5.7)
Mean Plasma Glucose: 94 mg/dL
eAG (mmol/L): 5.2 mmol/L

## 2022-05-24 LAB — DRUG ABUSE PANEL 8, WITH CONFIRMATION, SERUM
Amphetamines: NEGATIVE
Barbiturates: NEGATIVE
Benzodiazepines: NEGATIVE
Cocaine Metabolites: NEGATIVE
DELTA 9 THC CARBOXY ACID: 100 ng/mL
DELTA 9 THC: 18 ng/mL
Marijuana: POSITIVE — AB
Methadone: NEGATIVE
Opiates: NEGATIVE
PCP (PHENCYCLIDINE): NEGATIVE

## 2022-05-24 LAB — ACTH: C206 ACTH: <5 pg/mL — ABNORMAL LOW (ref 6–50)

## 2022-05-24 LAB — T4, FREE: Free T4: 1.1 ng/dL (ref 0.8–1.4)

## 2022-05-24 LAB — CORTISOL: Cortisol, Plasma: 17.8 ug/dL

## 2022-05-24 LAB — TSH: TSH: 0.95 mIU/L

## 2022-05-24 LAB — VITAMIN D 25 HYDROXY (VIT D DEFICIENCY, FRACTURES): Vit D, 25-Hydroxy: 25 ng/mL — ABNORMAL LOW (ref 30–100)

## 2022-05-24 MED ORDER — LEVOTHYROXINE SODIUM 25 MCG PO TABS: ORAL_TABLET | ORAL | 5 refills | Status: DC

## 2022-05-24 MED ORDER — ERGOCALCIFEROL 1.25 MG (50000 UT) PO CAPS: 50000.0000 [IU] | ORAL_CAPSULE | ORAL | 0 refills | Status: DC

## 2022-05-24 NOTE — Addendum Note (Signed)
Addended by: Judene Companion on: 05/24/2022 02:44 PM   Modules accepted: Orders

## 2022-05-27 ENCOUNTER — Telehealth (INDEPENDENT_AMBULATORY_CARE_PROVIDER_SITE_OTHER): Payer: Self-pay

## 2022-05-27 NOTE — Telephone Encounter (Signed)
-----   Message from [name], MD sent at 05/24/2022  2:00 PM EDT ----- Please call Asra with the following message: Jacqueline Green, Your thyroid labs are normal.  Please continue your current dose of levothyroxine 12.53mcg daily. Your vit D level is just below normal.  I want you to keep taking the once weekly vitamin D supplement.   You are making enough cortisol, your blood counts are normal, and your A1c (average blood sugar over 3 months) is normal. I cannot find a reason for you to be losing weight. Your drug screen was positive only for marijuana.   Please let me know if you have questions! Dr. Larinda Buttery

## 2022-05-27 NOTE — Telephone Encounter (Signed)
Spoke with Jacqueline Green. Gave results. She wanted me to let her Jacqueline Green know too that she was only positive for marijuana. Jacqueline Green stated that she is just concerned that she has lost so much weight. Jacqueline Green let me read her the results of her labs and let Jacqueline Green know of Dr Homero Fellers statement of  I cannot find a reason for you to be losing weight. Jacqueline Green and Jacqueline Green were both satisfied with results. Ended call.

## 2022-05-28 ENCOUNTER — Other Ambulatory Visit: Payer: Self-pay | Admitting: Gastroenterology

## 2022-05-28 DIAGNOSIS — R1084 Generalized abdominal pain: Secondary | ICD-10-CM

## 2022-05-28 DIAGNOSIS — R195 Other fecal abnormalities: Secondary | ICD-10-CM

## 2022-06-13 ENCOUNTER — Ambulatory Visit
Admission: RE | Admit: 2022-06-13 | Discharge: 2022-06-13 | Disposition: A | Discharge status: Home / Self Care | Payer: No Typology Code available for payment source | Source: Ambulatory Visit | Attending: Gastroenterology | Admitting: Gastroenterology

## 2022-06-13 DIAGNOSIS — R1084 Generalized abdominal pain: Secondary | ICD-10-CM

## 2022-06-13 DIAGNOSIS — R195 Other fecal abnormalities: Secondary | ICD-10-CM

## 2022-06-13 MED ORDER — IOPAMIDOL (ISOVUE-300) INJECTION 61%
80.0000 mL | Freq: Once | INTRAVENOUS | Status: AC | PRN
  Administered 2022-06-13: 80 mL via INTRAVENOUS

## 2022-06-14 ENCOUNTER — Other Ambulatory Visit: Payer: Self-pay | Admitting: Gastroenterology

## 2022-06-14 ENCOUNTER — Other Ambulatory Visit (HOSPITAL_COMMUNITY): Payer: Self-pay | Admitting: Gastroenterology

## 2022-06-14 DIAGNOSIS — R198 Other specified symptoms and signs involving the digestive system and abdomen: Secondary | ICD-10-CM

## 2022-06-14 DIAGNOSIS — R1084 Generalized abdominal pain: Secondary | ICD-10-CM

## 2022-06-20 ENCOUNTER — Other Ambulatory Visit: Payer: No Typology Code available for payment source

## 2022-06-24 ENCOUNTER — Encounter (HOSPITAL_COMMUNITY)
Admission: RE | Admit: 2022-06-24 | Discharge: 2022-06-24 | Disposition: A | Discharge status: Home / Self Care | Payer: No Typology Code available for payment source | Source: Ambulatory Visit | Attending: Gastroenterology | Admitting: Gastroenterology

## 2022-06-24 DIAGNOSIS — R198 Other specified symptoms and signs involving the digestive system and abdomen: Secondary | ICD-10-CM | POA: Diagnosis present

## 2022-06-24 DIAGNOSIS — R1084 Generalized abdominal pain: Secondary | ICD-10-CM | POA: Insufficient documentation

## 2022-06-24 MED ORDER — TECHNETIUM TC 99M MEBROFENIN IV KIT
5.3000 | PACK | Freq: Once | INTRAVENOUS | Status: AC | PRN
  Administered 2022-06-24: 5.3 via INTRAVENOUS

## 2022-07-02 ENCOUNTER — Emergency Department (HOSPITAL_COMMUNITY)
Admission: EM | Admit: 2022-07-02 | Discharge: 2022-07-02 | Disposition: A | Discharge status: Home / Self Care | Payer: No Typology Code available for payment source | Attending: Emergency Medicine | Admitting: Emergency Medicine

## 2022-07-02 ENCOUNTER — Encounter (HOSPITAL_COMMUNITY): Payer: Self-pay | Admitting: Emergency Medicine

## 2022-07-02 ENCOUNTER — Other Ambulatory Visit: Payer: Self-pay

## 2022-07-02 ENCOUNTER — Emergency Department (HOSPITAL_COMMUNITY): Payer: No Typology Code available for payment source

## 2022-07-02 DIAGNOSIS — R1011 Right upper quadrant pain: Secondary | ICD-10-CM | POA: Diagnosis not present

## 2022-07-02 DIAGNOSIS — E039 Hypothyroidism, unspecified: Secondary | ICD-10-CM | POA: Insufficient documentation

## 2022-07-02 LAB — COMPREHENSIVE METABOLIC PANEL
ALT: 36 U/L (ref 0–44)
AST: 26 U/L (ref 15–41)
Albumin: 4.5 g/dL (ref 3.5–5.0)
Alkaline Phosphatase: 44 U/L (ref 38–126)
Anion gap: 5 (ref 5–15)
BUN: 8 mg/dL (ref 6–20)
CO2: 26 mmol/L (ref 22–32)
Calcium: 9.5 mg/dL (ref 8.9–10.3)
Chloride: 112 mmol/L — ABNORMAL HIGH (ref 98–111)
Creatinine, Ser: 0.61 mg/dL (ref 0.44–1.00)
GFR, Estimated: >60 mL/min (ref >60)
Glucose, Bld: 81 mg/dL (ref 70–99)
Potassium: 3.9 mmol/L (ref 3.5–5.1)
Sodium: 143 mmol/L (ref 135–145)
Total Bilirubin: 0.9 mg/dL (ref 0.3–1.2)
Total Protein: 7.2 g/dL (ref 6.5–8.1)

## 2022-07-02 LAB — URINALYSIS, ROUTINE W REFLEX MICROSCOPIC
Bilirubin Urine: NEGATIVE
Glucose, UA: NEGATIVE mg/dL
Ketones, ur: NEGATIVE mg/dL
Leukocytes,Ua: NEGATIVE
Nitrite: NEGATIVE
Protein, ur: NEGATIVE mg/dL
Specific Gravity, Urine: 1.002 — ABNORMAL LOW (ref 1.005–1.030)
pH: 6 (ref 5.0–8.0)

## 2022-07-02 LAB — PREGNANCY, URINE: Preg Test, Ur: NEGATIVE

## 2022-07-02 LAB — CBC WITH DIFFERENTIAL/PLATELET
Abs Immature Granulocytes: 0.03 10*3/uL (ref 0.00–0.07)
Basophils Absolute: 0.1 10*3/uL (ref 0.0–0.1)
Basophils Relative: 1 %
Eosinophils Absolute: 0.1 10*3/uL (ref 0.0–0.5)
Eosinophils Relative: 1 %
HCT: 41.1 % (ref 36.0–46.0)
Hemoglobin: 13.8 g/dL (ref 12.0–15.0)
Immature Granulocytes: 0 %
Lymphocytes Relative: 18 %
Lymphs Abs: 1.5 10*3/uL (ref 0.7–4.0)
MCH: 29.6 pg (ref 26.0–34.0)
MCHC: 33.6 g/dL (ref 30.0–36.0)
MCV: 88.2 fL (ref 80.0–100.0)
Monocytes Absolute: 0.3 10*3/uL (ref 0.1–1.0)
Monocytes Relative: 4 %
Neutro Abs: 6.5 10*3/uL (ref 1.7–7.7)
Neutrophils Relative %: 76 %
Platelets: 194 10*3/uL (ref 150–400)
RBC: 4.66 MIL/uL (ref 3.87–5.11)
RDW: 12.9 % (ref 11.5–15.5)
WBC: 8.4 10*3/uL (ref 4.0–10.5)
nRBC: 0 % (ref 0.0–0.2)

## 2022-07-02 LAB — LIPASE, BLOOD: Lipase: 27 U/L (ref 11–51)

## 2022-07-02 MED ORDER — KETOROLAC TROMETHAMINE 15 MG/ML IJ SOLN
15.0000 mg | Freq: Once | INTRAMUSCULAR | Status: AC
  Administered 2022-07-02: 15 mg via INTRAVENOUS
  Filled 2022-07-02: qty 1

## 2022-07-02 MED ORDER — ALUM & MAG HYDROXIDE-SIMETH 200-200-20 MG/5ML PO SUSP
30.0000 mL | Freq: Once | ORAL | Status: AC
  Administered 2022-07-02: 30 mL via ORAL
  Filled 2022-07-02: qty 30

## 2022-07-02 MED ORDER — FAMOTIDINE IN NACL 20-0.9 MG/50ML-% IV SOLN
20.0000 mg | Freq: Once | INTRAVENOUS | Status: AC
  Administered 2022-07-02: 20 mg via INTRAVENOUS
  Filled 2022-07-02: qty 50

## 2022-07-02 MED ORDER — SUCRALFATE 1 G PO TABS: 1.0000 g | ORAL_TABLET | Freq: Three times a day (TID) | ORAL | 0 refills | Status: DC

## 2022-07-02 MED ORDER — ACETAMINOPHEN-CODEINE 300-30 MG PO TABS: 1.0000 | ORAL_TABLET | Freq: Four times a day (QID) | ORAL | 0 refills | Status: DC | PRN

## 2022-07-02 MED ORDER — ONDANSETRON HCL 4 MG PO TABS: 4.0000 mg | ORAL_TABLET | ORAL | 0 refills | Status: DC | PRN

## 2022-07-02 MED ORDER — LIDOCAINE VISCOUS HCL 2 % MT SOLN
15.0000 mL | Freq: Once | OROMUCOSAL | Status: AC
  Administered 2022-07-02: 15 mL via ORAL
  Filled 2022-07-02: qty 15

## 2022-07-02 NOTE — ED Provider Notes (Signed)
[facility] DEPT Provider Note   CSN: [id number] Arrival date & time: 07/02/22  0844     History {Add pertinent medical, surgical, social history, OB history to HPI:1} Chief Complaint  Patient presents with   Abdominal Pain    Jacqueline Green is a 21 y.o. female.  Patient as above with significant medical history as below, including autoimmune hypothyroidism, seasonal allergies, chronic diarrhea who presents to the ED with complaint of "gallbladder pain."  Patient intermittent right upper quadrant abdominal pain, since she experienced episode was around 4 AM this morning.  Pain is sharp, stabbing in nature, some radiation to her shoulder.  Pain is improved since the onset.  No nausea or vomiting pain or change in bowel or bladder function or baseline.  No pain to her lower abdomen.  No medications prior to arrival.  She underwent HIDA scan on 7/ 3 with reduced gallbladder ejection fraction approximately 13%.  Patient reports she has appointment with surgery next month for evaluation; she is not sure who the surgeon is that she has appoint with.     Past Medical History:  Diagnosis Date   Acquired autoimmune hypothyroidism    Dx 04/2015.  TSH 124, FT4 0.2. TPO ab > 900   Seasonal allergies    takes zyrtec and flonase prn    Past Surgical History:  Procedure Laterality Date   none       The history is provided by the patient. No language interpreter was used.  Abdominal Pain Associated symptoms: no chest pain, no cough, no dysuria, no fever, no nausea and no shortness of breath        Home Medications Prior to Admission medications   Medication Sig Start Date End Date Taking? Authorizing Provider  cetirizine (ZYRTEC) 10 MG tablet Take 10 mg by mouth daily. Patient not taking: Reported on 03/29/2021    [provider]  ergocalciferol (VITAMIN D2) 1.25 MG (50000 UT) capsule Take 1 capsule (50,000 Units total) by mouth once a week. 05/24/22    Levon Hedger, MD  levonorgestrel [facility]) 19.5 MG IUD by Intrauterine route once.    [provider]  levothyroxine (SYNTHROID) 25 MCG tablet TAKE 1/2 TABLET(12.5 MCG) BY MOUTH DAILY 05/24/22   Levon Hedger, MD  Melatonin 10 MG TABS Take by mouth. Patient not taking: Reported on 12/28/2020    [provider]  omeprazole (PRILOSEC) 20 MG capsule TAKE 1 CAPSULE BY MOUTH TWICE DAILY 30 MINUTES BEFORE A MEAL ON AN EMPTY STOMACH Patient not taking: Reported on 12/28/2020 09/26/20   [provider]      Allergies    Strawberry (diagnostic)    Review of Systems   Review of Systems  Constitutional:  Negative for activity change and fever.  HENT:  Negative for facial swelling and trouble swallowing.   Eyes:  Negative for discharge and redness.  Respiratory:  Negative for cough and shortness of breath.   Cardiovascular:  Negative for chest pain and palpitations.  Gastrointestinal:  Positive for abdominal pain. Negative for nausea.  Genitourinary:  Negative for dysuria and flank pain.  Musculoskeletal:  Negative for back pain and gait problem.  Skin:  Negative for pallor and rash.  Neurological:  Negative for syncope and headaches.    Physical Exam Updated Vital Signs BP 123/77 (BP Location: Left Arm)   Pulse 73   Temp 98 F (36.7 C) (Oral)   Resp 18   SpO2 100%  Physical Exam Vitals and nursing note  reviewed.  Constitutional:      General: She is not in acute distress.    Appearance: Normal appearance. She is well-developed. She is not ill-appearing or diaphoretic.  HENT:     Head: Normocephalic and atraumatic.     Right Ear: External ear normal.     Left Ear: External ear normal.     Nose: Nose normal.     Mouth/Throat:     Mouth: Mucous membranes are moist.  Eyes:     General: No scleral icterus.       Right eye: No discharge.        Left eye: No discharge.  Cardiovascular:     Rate and Rhythm: Normal rate and regular rhythm.      Pulses: Normal pulses.     Heart sounds: Normal heart sounds.  Pulmonary:     Effort: Pulmonary effort is normal. No respiratory distress.     Breath sounds: Normal breath sounds.  Abdominal:     General: Abdomen is flat.     Palpations: Abdomen is soft.     Tenderness: There is abdominal tenderness in the right upper quadrant. There is no guarding or rebound.     Comments: Not peritoneal abdomen  Musculoskeletal:        General: Normal range of motion.     Cervical back: Normal range of motion.     Right lower leg: No edema.     Left lower leg: No edema.  Skin:    General: Skin is warm and dry.     Capillary Refill: Capillary refill takes less than 2 seconds.  Neurological:     Mental Status: She is alert.  Psychiatric:        Mood and Affect: Mood normal.        Behavior: Behavior normal.     ED Results / Procedures / Treatments   Labs (all labs ordered are listed, but only abnormal results are displayed) Labs Reviewed - No data to display  EKG None  Radiology No results found.  Procedures Procedures  {Document cardiac monitor, telemetry assessment procedure when appropriate:1}  Medications Ordered in ED Medications - No data to display  ED Course/ Medical Decision Making/ A&P                           Medical Decision Making Amount and/or Complexity of Data Reviewed Labs: ordered. Radiology: ordered.  Risk OTC drugs. Prescription drug management.    CC: epig pain  This patient presents to the Emergency Department for the above complaint. This involves an extensive number of treatment options and is a complaint that carries with it a high risk of complications and morbidity. Vital signs were reviewed. Serious etiologies considered.  Differential diagnosis includes but is not exclusive to acute cholecystitis, intrathoracic causes for epigastric abdominal pain, gastritis, duodenitis, pancreatitis, small bowel or large bowel obstruction, abdominal aortic  aneurysm, hernia, gastritis, etc.   Record review:  Previous records obtained and reviewed prior ED visits, prior labs and imaging  Additional history obtained from mother at bedside  Medical and surgical history as noted above.   Work up as above, notable for:  Labs & imaging results that were available during my care of the patient were visualized by me and considered in my medical decision making.  Physical exam as above.   I ordered imaging studies which included upper quadrant ultrasound. I visualized the imaging, interpreted images, and I agree with radiologist  interpretation.  No acute abnormalities  Labs reviewed and are stable.  Liver enzymes are unremarkable, alk phos is normal, T. bili 0.9.  Management: GI cocktail, Toradol  ED Course:     Reassessment:  Symptoms have improved greatly.  Admission was considered.   Patient is adamant that she needs her gallbladder removed immediately.  HIDA scan reviewed, concern for ?biliary dyskinesia.  No evidence of cholelithiasis or cholecystitis on imaging today.  Normal labs. Will d/w general surgery.             Social determinants of health include -  Social History   Socioeconomic History   Marital status: Single    Spouse name: Not on file   Number of children: Not on file   Years of education: Not on file   Highest education level: Not on file  Occupational History   Not on file  Tobacco Use   Smoking status: Never   Smokeless tobacco: Never  Substance and Sexual Activity   Alcohol use: Not on file   Drug use: Not on file   Sexual activity: Not on file  Other Topics Concern   Not on file  Social History Narrative   Student at [employer]   Social Determinants of Health   Financial Resource Strain: Not on file  Food Insecurity: Not on file  Transportation Needs: Not on file  Physical Activity: Not on file  Stress: Not on file  Social Connections: Not on file  Intimate Partner Violence: Not on  file      This chart was dictated using voice recognition software.  Despite best efforts to proofread,  errors can occur which can change the documentation meaning.   {Document critical care time when appropriate:1} {Document review of labs and clinical decision tools ie heart score, Chads2Vasc2 etc:1}  {Document your independent review of radiology images, and any outside records:1} {Document your discussion with family members, caretakers, and with consultants:1} {Document social determinants of health affecting pt's care:1} {Document your decision making why or why not admission, treatments were needed:1} Final Clinical Impression(s) / ED Diagnoses Final diagnoses:  None    Rx / DC Orders ED Discharge Orders     None

## 2022-07-02 NOTE — Discharge Instructions (Signed)
It was a pleasure caring for you today in the emergency department.  Please return to the emergency department for any worsening or worrisome symptoms.  Expect a phone call from general surgery regarding your upcoming appointment, they are attempting to get your appointment moved up to a sooner date.

## 2022-07-02 NOTE — ED Triage Notes (Signed)
Pt reports severe abd pain. Pt reports needing her gallbladder remove but states her pain is unbearable and cannot wait any longer. Denies N/V

## 2022-07-16 ENCOUNTER — Ambulatory Visit: Payer: Self-pay | Admitting: Surgery

## 2022-07-16 DIAGNOSIS — R1011 Right upper quadrant pain: Secondary | ICD-10-CM | POA: Insufficient documentation

## 2022-07-16 NOTE — H&P (Signed)
History of Present Illness: Jacqueline Green is a 21 y.o. female who was referred to me for evaluation of biliary dyskinesia.  She has been having RUQ abdominal pain for the last approximately 3 months. It occurs after eating, especially fatty or greasy foods. She also reports pain in the center of her chest for much longer, which usually occurs at night and does not seem to be associated with eating. She has also been having diarrhea, for which she takes Imodium. She was previously seen at [facility] for workup of her diarrhea. She had a CT on 6/22 which was unremarkable. A HIDA on 7/3 showed a reduced gallbladder EF of 13%. She was recently seen in the [facility] ED on 7/11 with an exacerbation of her pain and a RUQ [place] was done. This was normal with no stones. LFTs and lipase were normal at that time.    She is overall in good health and has not had any prior abdominal surgeries. She is here today with her mother.     Review of Systems: A complete review of systems was obtained from the patient.  I have reviewed this information and discussed as appropriate with the patient.  See HPI as well for other ROS.     Medical History: Past Medical HistoryExpand by Default Past Medical History: Diagnosis Date  GERD (gastroesophageal reflux disease)    Thyroid disease        There is no problem list on file for this patient.     Past Surgical History History reviewed. No pertinent surgical history.     Allergies No Known Allergies    Current Outpatient Medications on File Prior to Visit Medication Sig Dispense Refill  ergocalciferol, vitamin D2, 1,250 mcg (50,000 unit) capsule 1 capsule      sucralfate (CARAFATE) 1 gram tablet TAKE 1 TABLET BY MOUTH TWICE DAILY ON AN EMPTY STOMACH      SYNTHROID 25 mcg tablet 1 tablet in the morning on an empty stomach      levonorgestreL (KYLEENA 19.5 MG) 17.5 mcg/24 hrs (5 yrs) 19.5 mg IUD One       No current facility-administered medications on file prior to  visit.     Family History Family History Problem Relation Age of Onset  Obesity Mother    Hyperlipidemia (Elevated cholesterol) Father    High blood pressure (Hypertension) Father    Obesity Father        Social History   Tobacco Use Smoking Status Every Day  Types: Cigarettes Smokeless Tobacco Current     Social History Social History    Socioeconomic History  Marital status: Single Tobacco Use  Smoking status: Every Day     Types: Cigarettes  Smokeless tobacco: Current Substance and Sexual Activity  Alcohol use: Never  Drug use: Yes     Types: Marijuana      Objective:     Vitals:   07/16/22 0921 BP: 122/70 Pulse: 66 Temp: 36.7 C (98.1 F) SpO2: 99% Weight: 49.4 kg (108 lb 12.8 oz) Height: 157.5 cm (5\' 2" )   Body mass index is 19.9 kg/m.   Physical Exam Vitals reviewed.  Constitutional:      General: She is not in acute distress.    Appearance: Normal appearance.  HENT:     Head: Normocephalic and atraumatic.  Eyes:     General: No scleral icterus.    Conjunctiva/sclera: Conjunctivae normal.  Cardiovascular:     Rate and Rhythm: Normal rate and regular rhythm.  Heart sounds: No murmur heard. Pulmonary:     Effort: Pulmonary effort is normal. No respiratory distress.     Breath sounds: Normal breath sounds.  Abdominal:     General: There is no distension.     Palpations: Abdomen is soft.     Tenderness: There is no abdominal tenderness.  Skin:    General: Skin is warm and dry.     Coloration: Skin is not jaundiced.  Neurological:     General: No focal deficit present.     Mental Status: She is alert and oriented to person, place, and time.  Psychiatric:        Mood and Affect: Mood normal.        Behavior: Behavior normal.            Assessment and Plan: Diagnoses and all orders for this visit:   Biliary dyskinesia       This is a 21 yo female with recurrent RUQ abdominal pain. I have personally reviewed her referral  notes and imaging. Workup is consistent with biliary dyskinesia. I suspect her intermittent chest pain is unrelated to the gallbladder, but it is possible this is also a symptom of her biliary dyskinesia. She has already had cardiology evaluation in the past for this. Her diarrhea also seems to be a separate issue, and I discussed this may worsen after cholecystectomy. Laparoscopic cholecystectomy was recommended. The details of this procedure were discussed with the patient, and she agrees to proceed with surgery. She will be contacted to schedule an elective surgery date.  Sophronia Simas, MD [facility] General, Hepatobiliary and Pancreatic Surgery 07/16/22 12:14 PM

## 2022-08-15 ENCOUNTER — Other Ambulatory Visit: Payer: Self-pay

## 2022-08-15 ENCOUNTER — Encounter (HOSPITAL_COMMUNITY): Payer: Self-pay | Admitting: Surgery

## 2022-08-15 NOTE — Progress Notes (Addendum)
Jacqueline Green denies chest pain or shortness of breath.  Patient denies having any s/s of Covid in her household, also denies any known exposure to Covid. Jacqueline Green came to the ED with right upper abdomen pain that moved up to the chest and shoulder, DR. Told patient that it was that it was the gallbladder causing the chest pain.  PCP is with [facility].

## 2022-08-16 ENCOUNTER — Ambulatory Visit (HOSPITAL_COMMUNITY): Payer: No Typology Code available for payment source | Admitting: Anesthesiology

## 2022-08-16 ENCOUNTER — Other Ambulatory Visit: Payer: Self-pay

## 2022-08-16 ENCOUNTER — Ambulatory Visit (HOSPITAL_COMMUNITY)
Admission: RE | Admit: 2022-08-16 | Discharge: 2022-08-16 | Disposition: A | Discharge status: Home / Self Care | Payer: No Typology Code available for payment source | Attending: Surgery | Admitting: Surgery

## 2022-08-16 ENCOUNTER — Encounter (HOSPITAL_COMMUNITY)
Admission: RE | Disposition: A | Discharge status: Home / Self Care | Payer: Self-pay | Source: Home / Self Care | Attending: Surgery

## 2022-08-16 ENCOUNTER — Ambulatory Visit (HOSPITAL_BASED_OUTPATIENT_CLINIC_OR_DEPARTMENT_OTHER): Payer: No Typology Code available for payment source | Admitting: Anesthesiology

## 2022-08-16 ENCOUNTER — Encounter (HOSPITAL_COMMUNITY): Payer: Self-pay | Admitting: Surgery

## 2022-08-16 DIAGNOSIS — K828 Other specified diseases of gallbladder: Secondary | ICD-10-CM

## 2022-08-16 DIAGNOSIS — K811 Chronic cholecystitis: Secondary | ICD-10-CM | POA: Diagnosis not present

## 2022-08-16 DIAGNOSIS — K219 Gastro-esophageal reflux disease without esophagitis: Secondary | ICD-10-CM | POA: Insufficient documentation

## 2022-08-16 DIAGNOSIS — E063 Autoimmune thyroiditis: Secondary | ICD-10-CM | POA: Diagnosis not present

## 2022-08-16 HISTORY — DX: Gastro-esophageal reflux disease without esophagitis: K21.9

## 2022-08-16 HISTORY — DX: Depression, unspecified: F32.A

## 2022-08-16 HISTORY — PX: CHOLECYSTECTOMY: SHX55

## 2022-08-16 LAB — CBC
HCT: 43.8 % (ref 36.0–46.0)
Hemoglobin: 15 g/dL (ref 12.0–15.0)
MCH: 29.5 pg (ref 26.0–34.0)
MCHC: 34.2 g/dL (ref 30.0–36.0)
MCV: 86.2 fL (ref 80.0–100.0)
Platelets: 202 10*3/uL (ref 150–400)
RBC: 5.08 MIL/uL (ref 3.87–5.11)
RDW: 11.9 % (ref 11.5–15.5)
WBC: 6.2 10*3/uL (ref 4.0–10.5)
nRBC: 0 % (ref 0.0–0.2)

## 2022-08-16 SURGERY — LAPAROSCOPIC CHOLECYSTECTOMY
Anesthesia: General | Site: Abdomen

## 2022-08-16 MED ORDER — CHLORHEXIDINE GLUCONATE 0.12 % MT SOLN
15.0000 mL | Freq: Once | OROMUCOSAL | Status: AC
Start: 2022-08-16 — End: 2022-08-16
  Administered 2022-08-16: 15 mL via OROMUCOSAL
  Filled 2022-08-16: qty 15

## 2022-08-16 MED ORDER — BUPIVACAINE-EPINEPHRINE 0.25% -1:200000 IJ SOLN
INTRAMUSCULAR | Status: DC | PRN
  Administered 2022-08-16: 30 mL

## 2022-08-16 MED ORDER — FENTANYL CITRATE (PF) 250 MCG/5ML IJ SOLN
INTRAMUSCULAR | Status: DC | PRN
  Administered 2022-08-16: 50 ug via INTRAVENOUS
  Administered 2022-08-16: 150 ug via INTRAVENOUS

## 2022-08-16 MED ORDER — SCOPOLAMINE 1 MG/3DAYS TD PT72
1.0000 | MEDICATED_PATCH | TRANSDERMAL | Status: DC
  Administered 2022-08-16: 1.5 mg via TRANSDERMAL
  Filled 2022-08-16: qty 1

## 2022-08-16 MED ORDER — MIDAZOLAM HCL 2 MG/2ML IJ SOLN: 0.5000 mg | Freq: Once | INTRAMUSCULAR | Status: DC | PRN

## 2022-08-16 MED ORDER — DEXAMETHASONE SODIUM PHOSPHATE 10 MG/ML IJ SOLN
INTRAMUSCULAR | Status: DC | PRN
  Administered 2022-08-16: 10 mg via INTRAVENOUS

## 2022-08-16 MED ORDER — ACETAMINOPHEN 500 MG PO TABS: 1000.0000 mg | ORAL_TABLET | Freq: Once | ORAL | Status: DC

## 2022-08-16 MED ORDER — MIDAZOLAM HCL 2 MG/2ML IJ SOLN
INTRAMUSCULAR | Status: AC
  Filled 2022-08-16: qty 2

## 2022-08-16 MED ORDER — LIDOCAINE 2% (20 MG/ML) 5 ML SYRINGE
INTRAMUSCULAR | Status: DC | PRN
  Administered 2022-08-16: 40 mg via INTRAVENOUS

## 2022-08-16 MED ORDER — HYDROCODONE-ACETAMINOPHEN 5-325 MG PO TABS: 1.0000 | ORAL_TABLET | Freq: Four times a day (QID) | ORAL | 0 refills | Status: AC | PRN

## 2022-08-16 MED ORDER — SODIUM CHLORIDE 0.9 % IR SOLN
Status: DC | PRN
  Administered 2022-08-16: 1000 mL

## 2022-08-16 MED ORDER — DEXAMETHASONE SODIUM PHOSPHATE 10 MG/ML IJ SOLN
INTRAMUSCULAR | Status: AC
  Filled 2022-08-16: qty 2

## 2022-08-16 MED ORDER — LACTATED RINGERS IV SOLN: INTRAVENOUS | Status: DC

## 2022-08-16 MED ORDER — LIDOCAINE 2% (20 MG/ML) 5 ML SYRINGE
INTRAMUSCULAR | Status: AC
  Filled 2022-08-16: qty 15

## 2022-08-16 MED ORDER — GABAPENTIN 300 MG PO CAPS
300.0000 mg | ORAL_CAPSULE | ORAL | Status: AC
  Administered 2022-08-16: 300 mg via ORAL
  Filled 2022-08-16: qty 1

## 2022-08-16 MED ORDER — SUGAMMADEX SODIUM 200 MG/2ML IV SOLN
INTRAVENOUS | Status: DC | PRN
  Administered 2022-08-16: 200 mg via INTRAVENOUS

## 2022-08-16 MED ORDER — ROCURONIUM BROMIDE 10 MG/ML (PF) SYRINGE
PREFILLED_SYRINGE | INTRAVENOUS | Status: DC | PRN
  Administered 2022-08-16: 60 mg via INTRAVENOUS

## 2022-08-16 MED ORDER — ACETAMINOPHEN 500 MG PO TABS
1000.0000 mg | ORAL_TABLET | ORAL | Status: AC
  Administered 2022-08-16: 1000 mg via ORAL
  Filled 2022-08-16: qty 2

## 2022-08-16 MED ORDER — BUPIVACAINE-EPINEPHRINE (PF) 0.25% -1:200000 IJ SOLN
INTRAMUSCULAR | Status: AC
Start: 2022-08-16 — End: ?
  Filled 2022-08-16: qty 30

## 2022-08-16 MED ORDER — OXYCODONE HCL 5 MG/5ML PO SOLN: 5.0000 mg | Freq: Once | ORAL | Status: DC | PRN

## 2022-08-16 MED ORDER — CEFAZOLIN SODIUM-DEXTROSE 2-4 GM/100ML-% IV SOLN
2.0000 g | INTRAVENOUS | Status: AC
  Administered 2022-08-16: 2 g via INTRAVENOUS
  Filled 2022-08-16: qty 100

## 2022-08-16 MED ORDER — 0.9 % SODIUM CHLORIDE (POUR BTL) OPTIME
TOPICAL | Status: DC | PRN
  Administered 2022-08-16: 1000 mL

## 2022-08-16 MED ORDER — HYDROMORPHONE HCL 1 MG/ML IJ SOLN
INTRAMUSCULAR | Status: AC
  Filled 2022-08-16: qty 1

## 2022-08-16 MED ORDER — ONDANSETRON HCL 4 MG/2ML IJ SOLN
INTRAMUSCULAR | Status: AC
Start: 2022-08-16 — End: ?
  Filled 2022-08-16: qty 2

## 2022-08-16 MED ORDER — ROCURONIUM BROMIDE 10 MG/ML (PF) SYRINGE
PREFILLED_SYRINGE | INTRAVENOUS | Status: AC
Start: 2022-08-16 — End: ?
  Filled 2022-08-16: qty 10

## 2022-08-16 MED ORDER — PROPOFOL 10 MG/ML IV BOLUS
INTRAVENOUS | Status: DC | PRN
  Administered 2022-08-16: 200 mg via INTRAVENOUS

## 2022-08-16 MED ORDER — OXYCODONE HCL 5 MG PO TABS: 5.0000 mg | ORAL_TABLET | Freq: Once | ORAL | Status: DC | PRN

## 2022-08-16 MED ORDER — PROPOFOL 10 MG/ML IV BOLUS
INTRAVENOUS | Status: AC
  Filled 2022-08-16: qty 20

## 2022-08-16 MED ORDER — HYDROMORPHONE HCL 1 MG/ML IJ SOLN
0.2500 mg | INTRAMUSCULAR | Status: DC | PRN
  Administered 2022-08-16 (×2): 0.5 mg via INTRAVENOUS

## 2022-08-16 MED ORDER — ONDANSETRON HCL 4 MG/2ML IJ SOLN
INTRAMUSCULAR | Status: DC | PRN
  Administered 2022-08-16: 4 mg via INTRAVENOUS

## 2022-08-16 MED ORDER — MIDAZOLAM HCL 5 MG/5ML IJ SOLN
INTRAMUSCULAR | Status: DC | PRN
  Administered 2022-08-16: 2 mg via INTRAVENOUS

## 2022-08-16 MED ORDER — FENTANYL CITRATE (PF) 250 MCG/5ML IJ SOLN
INTRAMUSCULAR | Status: AC
  Filled 2022-08-16: qty 5

## 2022-08-16 MED ORDER — PROMETHAZINE HCL 25 MG/ML IJ SOLN: 6.2500 mg | INTRAMUSCULAR | Status: DC | PRN

## 2022-08-16 MED ORDER — MEPERIDINE HCL 25 MG/ML IJ SOLN: 6.2500 mg | INTRAMUSCULAR | Status: DC | PRN

## 2022-08-16 MED ORDER — SUCCINYLCHOLINE CHLORIDE 200 MG/10ML IV SOSY
PREFILLED_SYRINGE | INTRAVENOUS | Status: AC
  Filled 2022-08-16: qty 10

## 2022-08-16 MED ORDER — ORAL CARE MOUTH RINSE: 15.0000 mL | Freq: Once | OROMUCOSAL | Status: AC

## 2022-08-16 SURGICAL SUPPLY — 46 items
ADH SKN CLS APL DERMABOND .7 (GAUZE/BANDAGES/DRESSINGS) ×1
APL PRP STRL LF DISP 70% ISPRP (MISCELLANEOUS) ×1
APPLIER CLIP 5 13 M/L LIGAMAX5 (MISCELLANEOUS) ×1
APR CLP MED LRG 5 ANG JAW (MISCELLANEOUS) ×1
BAG COUNTER SPONGE SURGICOUNT (BAG) ×1 IMPLANT
BAG SPNG CNTER NS LX DISP (BAG) ×1
BLADE CLIPPER SURG (BLADE) IMPLANT
CANISTER SUCT 3000ML PPV (MISCELLANEOUS) ×1 IMPLANT
CHLORAPREP W/TINT 26 (MISCELLANEOUS) ×1 IMPLANT
CLIP APPLIE 5 13 M/L LIGAMAX5 (MISCELLANEOUS) ×1 IMPLANT
COVER SURGICAL LIGHT HANDLE (MISCELLANEOUS) ×1 IMPLANT
DERMABOND ADVANCED (GAUZE/BANDAGES/DRESSINGS) ×1
DERMABOND ADVANCED .7 DNX12 (GAUZE/BANDAGES/DRESSINGS) ×1 IMPLANT
ELECT REM PT RETURN 9FT ADLT (ELECTROSURGICAL) ×1
ELECTRODE REM PT RTRN 9FT ADLT (ELECTROSURGICAL) ×1 IMPLANT
GLOVE BIOGEL PI IND STRL 6 (GLOVE) ×1 IMPLANT
GLOVE BIOGEL PI INDICATOR 6 (GLOVE) ×1
GLOVE BIOGEL PI MICRO 5.5 (GLOVE) ×1
GLOVE BIOGEL PI MICRO STRL 5.5 (GLOVE) ×1 IMPLANT
GOWN STRL REUS W/ TWL LRG LVL3 (GOWN DISPOSABLE) ×3 IMPLANT
GOWN STRL REUS W/TWL LRG LVL3 (GOWN DISPOSABLE) ×3
KIT BASIN OR (CUSTOM PROCEDURE TRAY) ×1 IMPLANT
KIT TURNOVER KIT B (KITS) ×1 IMPLANT
L-HOOK LAP DISP 36CM (ELECTROSURGICAL) ×1
LHOOK LAP DISP 36CM (ELECTROSURGICAL) ×1 IMPLANT
NDL INSUFFLATION 14GA 120MM (NEEDLE) IMPLANT
NEEDLE INSUFFLATION 14GA 120MM (NEEDLE) IMPLANT
NS IRRIG 1000ML POUR BTL (IV SOLUTION) ×1 IMPLANT
PAD ARMBOARD 7.5X6 YLW CONV (MISCELLANEOUS) ×1 IMPLANT
PENCIL BUTTON HOLSTER BLD 10FT (ELECTRODE) ×1 IMPLANT
POUCH SPECIMEN RETRIEVAL 10MM (ENDOMECHANICALS) ×1 IMPLANT
SCISSORS LAP 5X35 DISP (ENDOMECHANICALS) ×1 IMPLANT
SET IRRIG TUBING LAPAROSCOPIC (IRRIGATION / IRRIGATOR) ×1 IMPLANT
SET TUBE SMOKE EVAC HIGH FLOW (TUBING) ×1 IMPLANT
SLEEVE ENDOPATH XCEL 5M (ENDOMECHANICALS) ×2 IMPLANT
SUT MNCRL AB 4-0 PS2 18 (SUTURE) ×1 IMPLANT
SUT VIC AB 3-0 SH 27 (SUTURE)
SUT VIC AB 3-0 SH 27XBRD (SUTURE) IMPLANT
TOWEL GREEN STERILE (TOWEL DISPOSABLE) ×1 IMPLANT
TOWEL GREEN STERILE FF (TOWEL DISPOSABLE) ×1 IMPLANT
TRAY LAPAROSCOPIC MC (CUSTOM PROCEDURE TRAY) ×1 IMPLANT
TROCAR XCEL 12X100 BLDLESS (ENDOMECHANICALS) IMPLANT
TROCAR XCEL BLUNT TIP 100MML (ENDOMECHANICALS) ×1 IMPLANT
TROCAR Z-THREAD OPTICAL 5X100M (TROCAR) ×1 IMPLANT
WARMER LAPAROSCOPE (MISCELLANEOUS) ×1 IMPLANT
WATER STERILE IRR 1000ML POUR (IV SOLUTION) ×1 IMPLANT

## 2022-08-16 NOTE — Discharge Instructions (Addendum)
[facility] DISCHARGE INSTRUCTIONS  Activity No heavy lifting greater than 15 pounds for 4 weeks after surgery. Ok to shower in 24 hours, but do not bathe or submerge incisions underwater. Do not drive while taking narcotic pain medication.  Wound Care Your incisions are covered with skin glue called Dermabond. This will peel off on its own over time. You may shower in 24 hours and allow warm soapy water to run over your incisions. Gently pat dry. Do not submerge your incision underwater. Monitor your incision for any new redness, tenderness, or drainage.  When to Call us: Fever greater than 100.5 New redness, drainage, or swelling at incision site Severe pain, nausea, or vomiting Jaundice (yellowing of the whites of the eyes or skin)  Follow-up You have an appointment scheduled with Dr. Freida Busman on August 30, 2022 at 4:20pm. This will be at the [facility] office at [address], [address], [place], [place]. Please arrive at least 15 minutes prior to your scheduled appointment time.  For questions or concerns, please call the office at [phone].

## 2022-08-16 NOTE — H&P (Signed)
Jacqueline Green is an 21 y.o. female.   Chief Complaint: biliary dyskinesia HPI: Jacqueline Green is a 21 y.o. female who was referred to me for evaluation of biliary dyskinesia.  She has been having RUQ abdominal pain for the last approximately 3 months. It occurs after eating, especially fatty or greasy foods. She also reports pain in the center of her chest for much longer, which usually occurs at night and does not seem to be associated with eating. She has also been having diarrhea, for which she takes Imodium. She was previously seen at [facility] for workup of her diarrhea. She had a CT on 6/22 which was unremarkable. A HIDA on 7/3 showed a reduced gallbladder EF of 13%. She was recently seen in the [facility] ED on 7/11 with an exacerbation of her pain and a RUQ [place] was done. This was normal with no stones. LFTs and lipase were normal at that time. She was seen in the office in consultation on 7/25 and presents today for surgery.   She is overall in good health and has not had any prior abdominal surgeries.  Past Medical History:  Diagnosis Date   Acquired autoimmune hypothyroidism    Dx 04/2015.  TSH 124, FT4 0.2. TPO ab > 900   Depression    GERD (gastroesophageal reflux disease)    Seasonal allergies    takes zyrtec and flonase prn    Past Surgical History:  Procedure Laterality Date   none      Family History  Problem Relation Age of Onset   Thyroid disease Mother        had benign tumor causing hyperthyroidism in 1991, underwent partial thyroidectomy.  Was on synthroid for years post-op, then around 2010 she was able to stop synthroid     Hypertension Father    Social History:  reports that she has never smoked. She has never used smokeless tobacco. She reports current alcohol use. She reports current drug use. Frequency: 3.00 times per week. Drug: Marijuana.  Allergies:  Allergies  Allergen Reactions   Strawberry (Diagnostic) Hives    Medications Prior to Admission  Medication Sig  Dispense Refill   acetaminophen-codeine (TYLENOL #3) 300-30 MG tablet Take 1-2 tablets by mouth every 6 (six) hours as needed for moderate pain. 15 tablet 0   Brimonidine Tartrate (LUMIFY) 0.025 % SOLN Place 1 drop into both eyes once a week.     ergocalciferol (VITAMIN D2) 1.25 MG (50000 UT) capsule Take 1 capsule (50,000 Units total) by mouth once a week. 8 capsule 0   levonorgestrel (KYLEENA) 19.5 MG IUD 1 each by Intrauterine route once.     levothyroxine (SYNTHROID) 25 MCG tablet TAKE 1/2 TABLET(12.5 MCG) BY MOUTH DAILY 15 tablet 5   loperamide (IMODIUM A-D) 2 MG tablet Take 2 mg by mouth daily as needed for diarrhea or loose stools.     Probiotic Product (ALIGN) 4 MG CAPS Take 4 mg by mouth daily.     sucralfate (CARAFATE) 1 g tablet Take 1 tablet (1 g total) by mouth 4 (four) times daily -  with meals and at bedtime for 7 days. (Patient taking differently: Take 1 g by mouth daily as needed (Stomach pain).) 28 tablet 0   ondansetron (ZOFRAN) 4 MG tablet Take 1 tablet (4 mg total) by mouth every 4 (four) hours as needed for nausea or vomiting. 12 tablet 0   OVER THE COUNTER MEDICATION Place 1 Application rectally See admin instructions. Diltiazem 2%cr/Lidoc 2%oint (Compound)  APPLY PER RECTUM THREE TIMES DAILY AND 30 MINUTES PRIOR TO DEFECATION AS NEEDED RECTAL PAIN     Peppermint Oil (IBGARD) 90 MG CPCR Take 90 mg by mouth daily as needed (Stomach as needed).      No results found for this or any previous visit (from the past 48 hour(s)). No results found.  Review of Systems  Blood pressure 111/69, pulse 74, temperature 97.6 F (36.4 C), temperature source Oral, resp. rate 18, height 5\' 1"  (1.549 m), weight 47.6 kg, SpO2 100 %. Physical Exam Vitals reviewed.  Constitutional:      General: She is not in acute distress.    Appearance: Normal appearance.  HENT:     Head: Normocephalic and atraumatic.  Eyes:     General: No scleral icterus.    Conjunctiva/sclera: Conjunctivae  normal.  Pulmonary:     Effort: Pulmonary effort is normal. No respiratory distress.  Abdominal:     General: There is no distension.     Palpations: Abdomen is soft.     Tenderness: There is no abdominal tenderness.  Musculoskeletal:        General: Normal range of motion.     Cervical back: Normal range of motion.  Skin:    General: Skin is warm and dry.     Coloration: Skin is not jaundiced.  Neurological:     General: No focal deficit present.     Mental Status: She is alert and oriented to person, place, and time.  Psychiatric:        Mood and Affect: Mood normal.        Behavior: Behavior normal.      Assessment/Plan This is a 21 yo female with RUQ pain secondary to biliary dyskinesia. She is here today for laparoscopic cholecystectomy. Informed consent has been obtained. Proceed to the OR, plan for discharge home from PACU.  36, MD 08/16/2022, 8:25 AM

## 2022-08-16 NOTE — Anesthesia Procedure Notes (Signed)
Procedure Name: Intubation Date/Time: 08/16/2022 10:21 AM  Performed by: Myna Bright, CRNAPre-anesthesia Checklist: Patient identified, Emergency Drugs available, Suction available and Patient being monitored Patient Re-evaluated:Patient Re-evaluated prior to induction Oxygen Delivery Method: Circle system utilized Preoxygenation: Pre-oxygenation with 100% oxygen Induction Type: IV induction Ventilation: Mask ventilation without difficulty Laryngoscope Size: Mac and 3 Grade View: Grade I Tube type: Oral Tube size: 7.0 mm Number of attempts: 1 Airway Equipment and Method: Stylet Placement Confirmation: ETT inserted through vocal cords under direct vision, positive ETCO2 and breath sounds checked- equal and bilateral Secured at: 21 cm Tube secured with: Tape Dental Injury: Teeth and Oropharynx as per pre-operative assessment

## 2022-08-16 NOTE — Transfer of Care (Signed)
Immediate Anesthesia Transfer of Care Note  Patient: Jacqueline Green  Procedure(s) Performed: LAPAROSCOPIC CHOLECYSTECTOMY (Abdomen)  Patient Location: PACU  Anesthesia Type:General  Level of Consciousness: awake and drowsy  Airway & Oxygen Therapy: Patient Spontanous Breathing  Post-op Assessment: Report given to RN and Post -op Vital signs reviewed and stable  Post vital signs: Reviewed and stable  Last Vitals:  Vitals Value Taken Time  BP 117/81 08/16/22 1115  Temp    Pulse 47 08/16/22 1117  Resp 14 08/16/22 1117  SpO2 100 % 08/16/22 1117  Vitals shown include unvalidated device data.  Last Pain:  Vitals:   08/16/22 0800  TempSrc:   PainSc: 0-No pain         Complications: No notable events documented.

## 2022-08-16 NOTE — Op Note (Signed)
Date: 08/16/22  Patient: Jacqueline Green MRN: [id number]  Preoperative Diagnosis: Biliary dyskinesia Postoperative Diagnosis: Same  Procedure: Laparoscopic cholecystectomy  Surgeon: Sophronia Simas, MD  EBL: Minimal  Anesthesia: General endotracheal  Specimens: Gallbladder  Indications: [name] is a 21 yo female who presented with RUQ abdominal pain. Imaging did not show gallstones, but a HIDA scan showed a reduced EF of 13%. After a discussion of the risks and benefits of surgery, she agreed to proceed with cholecystectomy.  Findings: No evidence of acute cholecystitis.  Procedure details: Informed consent was obtained in the preoperative area prior to the procedure. The patient was brought to the operating room and placed on the table in the supine position. General anesthesia was induced and appropriate lines and drains were placed for intraoperative monitoring. Perioperative antibiotics were administered per SCIP guidelines. The abdomen was prepped and draped in the usual sterile fashion. A pre-procedure timeout was taken verifying patient identity, surgical site and procedure to be performed.  A small infraumbilical skin incision was made, the subcutaneous tissue was divided with cautery, and the umbilical stalk was grasped and elevated. The fascia was incised and the peritoneal cavity was directly visualized. A 8mm Hassan trocar was placed and the abdomen was insufflated. The peritoneal cavity was inspected with no evidence of visceral or vascular injury. Three 8mm ports were placed in the right subcostal margin, all under direct visualization. The fundus of the gallbladder was grasped and retracted cephalad. There were omental adhesions to the gallbladder, which were carefully taken down with cautery. The infundibulum was retracted laterally. The cystic triangle was dissected out using cautery and blunt dissection, and the critical view of safety was obtained. The cystic duct and cystic  artery were clipped and ligated, leaving two clips behind on the cystic duct stump. The gallbladder was taken off the liver using cautery, and the specimen was placed in an endocatch bag. The surgical site was irrigated with saline until the effluent was clear. Hemostasis was achieved in the gallbladder fossa using cautery. The cystic duct and artery stumps were visually inspected and there was no evidence of bile leak or bleeding. The ports were removed under direct visualization and the abdomen was desufflated. The specimen was extracted via the umbilical port site. The umbilical port site fascia was closed with a 0 vicryl suture. The skin at all port sites was closed with 4-0 monocryl subcuticular suture. Dermabond was applied.  The patient tolerated the procedure well with no apparent complications. All counts were correct x2 at the end of the procedure. The patient was extubated and taken to PACU in stable condition.  Sophronia Simas, MD 08/16/22 11:10 AM

## 2022-08-16 NOTE — Anesthesia Preprocedure Evaluation (Addendum)
Anesthesia Evaluation  Patient identified by MRN, date of birth, ID band Patient awake    Reviewed: Allergy & Precautions, NPO status , Patient's Chart, lab work & pertinent test results  History of Anesthesia Complications Negative for: history of anesthetic complications  Airway Mallampati: I  TM Distance: >3 FB Neck ROM: Full    Dental  (+) Dental Advisory Given, Teeth Intact   Pulmonary Current Smoker and Patient abstained from smoking.,    breath sounds clear to auscultation       Cardiovascular negative cardio ROS   Rhythm:Regular Rate:Normal     Neuro/Psych Depression negative neurological ROS     GI/Hepatic GERD  Medicated and Controlled,(+)     substance abuse  marijuana use,   Endo/Other  Hypothyroidism   Renal/GU negative Renal ROS     Musculoskeletal   Abdominal   Peds  Hematology negative hematology ROS (+)   Anesthesia Other Findings   Reproductive/Obstetrics                            Anesthesia Physical Anesthesia Plan  ASA: 2  Anesthesia Plan: General   Post-op Pain Management: Tylenol PO (pre-op)*   Induction: Intravenous  PONV Risk Score and Plan: 3 and Ondansetron, Dexamethasone and Scopolamine patch - Pre-op  Airway Management Planned: Oral ETT  Additional Equipment: None  Intra-op Plan:   Post-operative Plan: Extubation in OR  Informed Consent: I have reviewed the patients History and Physical, chart, labs and discussed the procedure including the risks, benefits and alternatives for the proposed anesthesia with the patient or authorized representative who has indicated his/her understanding and acceptance.     Dental advisory given  Plan Discussed with: CRNA and Surgeon  Anesthesia Plan Comments:        Anesthesia Quick Evaluation

## 2022-08-16 NOTE — Anesthesia Postprocedure Evaluation (Signed)
Anesthesia Post Note  Patient: Jacqueline Green  Procedure(s) Performed: LAPAROSCOPIC CHOLECYSTECTOMY (Abdomen)     Patient location during evaluation: PACU Anesthesia Type: General Level of consciousness: awake and alert, patient cooperative and oriented Pain management: pain level controlled Vital Signs Assessment: post-procedure vital signs reviewed and stable Respiratory status: spontaneous breathing, nonlabored ventilation and respiratory function stable Cardiovascular status: blood pressure returned to baseline and stable Postop Assessment: no apparent nausea or vomiting Anesthetic complications: no   No notable events documented.  Last Vitals:  Vitals:   08/16/22 1230 08/16/22 1239  BP: 120/85 113/69  Pulse: 67 (!) 54  Resp: 17 18  Temp:  36.5 C  SpO2: 100% 100%    Last Pain:  Vitals:   08/16/22 1239  TempSrc:   PainSc: 0-No pain                 Koleton Duchemin,E. Tawsha Terrero

## 2022-08-17 ENCOUNTER — Encounter (HOSPITAL_COMMUNITY): Payer: Self-pay | Admitting: Surgery

## 2022-08-19 LAB — SURGICAL PATHOLOGY

## 2022-08-22 ENCOUNTER — Encounter (INDEPENDENT_AMBULATORY_CARE_PROVIDER_SITE_OTHER): Payer: Self-pay | Admitting: Pediatrics

## 2022-08-22 ENCOUNTER — Ambulatory Visit (INDEPENDENT_AMBULATORY_CARE_PROVIDER_SITE_OTHER): Payer: No Typology Code available for payment source | Admitting: Pediatrics

## 2022-08-22 VITALS — BP 110/72 | HR 72 | Ht 62.76 in | Wt 110.8 lb

## 2022-08-22 DIAGNOSIS — E559 Vitamin D deficiency, unspecified: Secondary | ICD-10-CM | POA: Diagnosis not present

## 2022-08-22 DIAGNOSIS — E063 Autoimmune thyroiditis: Secondary | ICD-10-CM

## 2022-08-22 NOTE — Patient Instructions (Addendum)
It was a pleasure to see you in clinic today.   Feel free to contact our office during normal business hours at [phone] with questions or concerns. If you have an emergency after normal business hours, please call the above number to reach our answering service who will contact the on-call pediatric endocrinologist.  If you choose to communicate with us via MyChart, please do not send urgent messages as this inbox is NOT monitored on nights or weekends.  Urgent concerns should be discussed with the on-call pediatric endocrinologist.  Please go to the following address to have labs drawn after today's visit: [address] [place], [place] [address]  

## 2022-08-22 NOTE — Progress Notes (Addendum)
Pediatric Endocrinology Follow-up Visit  Chief Complaint: acquired hypothyroidism  HPI: Jacqueline Green is a 21 y.o. female presenting for follow-up of the above concerns.  she attended this visit alone.     1. Jacqueline Green initially presented to [facility] in 06/2015 after her PCP diagnosed primary hypothyroidism.  She had been seen by her PCP on 01/27/15, at which time she complained of weight gain, hair breakage, and fatigue.  Lifestyle modifications were recommended at that time to prevent weight gain.  She went back to her PCP in 03/2015 with similar symptoms (weight gain notably) after making diet changes and blood work was done.  Labs obtained 04/29/2015 showed TSH 124.9, free T4 0.2, hemoglobin A1c 5.6%, CMP normal except ALT slightly elevated at 36 (upper limit of normal 35).  Initial diagnosis of hypothyroidism is consistent with autoimmune hypothyroidism; on 06/07/2015 TSH was 71.542, FT4 0.85, TPO Ab >900, thyroglobulin Ab elevated at 7.  She was started on levothyroxine daily in 2016.  Her dose has been titrated since.  She was also noted to have a mildly low vitamin D level in the past (01/2018-25-OH D level 29); 1000 units vitamin D daily was recommended.    2. Since last visit on 05/16/22, she has been OK.  -Had gall bladder removed 08/16/22.  Much better with symptoms since (was having chest pain prior to surgery)  Thyroid symptoms: Brand name synthroid 12. daily Missed doses: the week after surgery  Heat or cold intolerance: neither Weight changes: Weight has decreased 2lb since last visit. Has cut out caffeine.  Drinks only water, fanta, and sprite now Energy level: great, better since stopping caffeine Sleep: good Skin changes: no Constipation/Diarrhea: none Difficulty swallowing: none Neck swelling: None Periods regular: IUD in place Tremor: None Palpitations: None  Vitamin D deficiency: Hx of Vit D defic with most recent level 25 in 04/2022 Taking supplementation: taking 50,000  units weekly  ROS: All systems reviewed with pertinent positives listed below; otherwise negative. Lymph nodes are better in her neck since her surgery  Past Medical History:   Past Medical History:  Diagnosis Date   Acquired autoimmune hypothyroidism    Dx 04/2015.  TSH 124, FT4 0.2. TPO ab > 900   Depression    GERD (gastroesophageal reflux disease)    Seasonal allergies    takes zyrtec and flonase prn   Meds: Outpatient Encounter Medications as of 08/22/2022  Medication Sig   Brimonidine Tartrate (LUMIFY) 0.025 % SOLN Place 1 drop into both eyes once a week.   ergocalciferol (VITAMIN D2) 1.25 MG (50000 UT) capsule Take 1 capsule (50,000 Units total) by mouth once a week.   levonorgestrel (KYLEENA) 19.5 MG IUD 1 each by Intrauterine route once.   levothyroxine (SYNTHROID) 25 MCG tablet TAKE 1/2 TABLET(12.5 MCG) BY MOUTH DAILY   Probiotic Product (ALIGN) 4 MG CAPS Take 4 mg by mouth daily.   loperamide (IMODIUM A-D) 2 MG tablet Take 2 mg by mouth daily as needed for diarrhea or loose stools. (Patient not taking: Reported on 08/22/2022)   ondansetron (ZOFRAN) 4 MG tablet Take 1 tablet (4 mg total) by mouth every 4 (four) hours as needed for nausea or vomiting. (Patient not taking: Reported on 08/22/2022)   OVER THE COUNTER MEDICATION Place 1 Application rectally See admin instructions. Diltiazem 2%cr/Lidoc 2%oint (Compound)   APPLY PER RECTUM THREE TIMES DAILY AND 30 MINUTES PRIOR TO DEFECATION AS NEEDED RECTAL PAIN (Patient not taking: Reported on 08/22/2022)   Peppermint Oil (IBGARD) 90 MG  CPCR Take 90 mg by mouth daily as needed (Stomach as needed). (Patient not taking: Reported on 08/22/2022)   sucralfate (CARAFATE) 1 g tablet Take 1 tablet (1 g total) by mouth 4 (four) times daily -  with meals and at bedtime for 7 days. (Patient taking differently: Take 1 g by mouth daily as needed (Stomach pain).)   No facility-administered encounter medications on file as of 08/22/2022.     Allergies: Allergies  Allergen Reactions   Strawberry (Diagnostic) Hives    Surgical History: Past Surgical History:  Procedure Laterality Date   CHOLECYSTECTOMY N/A 08/16/2022   Procedure: LAPAROSCOPIC CHOLECYSTECTOMY;  Surgeon: Fritzi Mandes, MD;  Location: [facility] OR;  Service: General;  Laterality: N/A;   none      Family History:  Family History  Problem Relation Age of Onset   Thyroid disease Mother        had benign tumor causing hyperthyroidism in 1991, underwent partial thyroidectomy.  Was on synthroid for years post-op, then around 2010 she was able to stop synthroid     Hypertension Father   Multiple maternal family members with hypothyroidism including MGM, 3 maternal aunts, and 1 maternal uncle.  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 Growth %ile SmartLinks can only be used for patients less than 64 years old.  Wt Readings from Last 3 Encounters:  08/22/22 110 lb 12.8 oz (50.3 kg)  08/16/22 105 lb (47.6 kg)  05/16/22 112 lb (50.8 kg)   Ht Readings from Last 3 Encounters:  08/22/22 5' 2.76" (1.594 m)  08/16/22 5\' 1"  (1.549 m)  05/03/22 5' (1.524 m)   Body mass index is 19.78 kg/m.  Facility age limit for growth %iles is 20 years. Facility age limit for growth %iles is 20 years.  General: Well developed, well nourished female in no acute distress.  Appears stated age Head: Normocephalic, atraumatic.   Eyes:  Pupils equal and round. EOMI.   Sclera  white.  No eye drainage.   Ears/Nose/Mouth/Throat: Nares patent, no nasal drainage.  Moist mucous membranes, normal dentition Neck: supple, no cervical lymphadenopathy, no thyromegaly Cardiovascular: regular rate, normal S1/S2, no murmurs Respiratory: No increased work of breathing.  Lungs clear to auscultation bilaterally.  No wheezes. Abdomen: soft, several laparoscopic incisions that are healing well Extremities: warm, well perfused, cap refill < 2 sec.   Musculoskeletal: Normal muscle mass.  Normal strength Skin: warm, dry.  No rash or lesions. Neurologic: alert and oriented, normal speech, no tremor   Laboratory Evaluation: 06/07/15 TSH 71.5, free T4 0.85 09/22/15 TSH 35.812, FT4 1.02, TPO Ab >900, thyroglobulin Ab 7 (<2) 10/20/15 TSH 6.293, FT4 1.09 12/27/15: TSH 0.103, FT4 2.09 02/07/16: TSH 0.82, FT4 1.6, T4 11.1    Ref. Range 12/28/2020 10:23  Mean Plasma Glucose Latest Units: mg/dL 94  Vitamin D, 02/25/2021 Latest Ref Range: 30 -  100 ng/mL 29 (L)  eAG (mmol/L) Latest Units: mmol/L 5.2  Hemoglobin A1C Latest Ref Range: <5.7 % of total Hgb 4.9  Glucose, Plasma Latest Ref Range: 65 - 139 mg/dL 82  TSH Latest Units: mIU/L 0.54  T4,Free(Direct) Latest Ref Range: 0.8 - 1.4 ng/dL 1.1  Thyroxine (T4) Latest Ref Range: 5.3 - 11.7 mcg/dL 6.9    Labs drawn 12/28/1094 by PCP: TSH 0.23 (0.34-4.5) FT4 1.08 (0.61-1.12) FT3 2.72 (2.5-3.9) CMP unremarkable except gluc 55 CBC unremarkable A1c 5.3%  --------------------------------  08/08/20 CLINICAL DATA:  21 year old female with a history of autoimmune hypothyroidism   EXAM: THYROID ULTRASOUND   TECHNIQUE: Ultrasound examination of the thyroid gland and adjacent soft tissues was performed.   COMPARISON:  None.   FINDINGS: Parenchymal Echotexture: Markedly heterogenous   Isthmus: 0.3 cm   Right lobe: 5.0 cm x 1.5 cm x 1.6 cm   Left lobe: 4.7 cm x 1.5 cm x 1.7 cm   _________________________________________________________    Estimated total number of nodules >/= 1 cm: 0   Number of spongiform nodules >/=  2 cm not described below (TR1): 0   Number of mixed cystic and solid nodules >/= 1.5 cm not described below (TR2): 0   _________________________________________________________   Nodule # 1:   Location: Right; Inferior   Maximum size: 0.9 cm; Other 2 dimensions: 0.8 cm x 0.4 cm   Composition: spongiform (0)   ACR TI-RADS recommendations:   Spongiform nodule does not meet criteria for surveillance or biopsy   _________________________________________________________   No adenopathy   IMPRESSION: Heterogeneous thyroid compatible with medical thyroid disease.   No thyroid nodule meets criteria for biopsy or surveillance, as designated by the newly established ACR TI-RADS criteria.   Recommendations follow those established by the new ACR TI-RADS criteria (J Am Coll Radiol 2017;14:587-595).     Electronically Signed   By: Gilmer Mor D.O.   On: 08/09/2020 15:32    ------------------------------------  ASSESSMENT/PLAN: Alverta Caccamo is a 21 y.o. female with autoimmune acquired hypothyroidism who is clinically euthyroid on low dose levothyroxine treatment.  Her thyroid lab trend has been erratic (was requiring larger doses of levothyroxine then had weight loss and significant reduction of thyroid hormone needs). Goal of treatment is TSH in the lower half of the normal range with FT4/T4 in the upper half of the normal range. Also she has vitamin D deficiency treated with ergocalciferol; will repeat level today.  She recently had her gall bladder removed and has had improvement in GI symptoms.  Autoimmune Acquired hypothyroidism -Will draw TSH, FT4 today -Continue current levothyroxine pending labs  2. Vitamin D deficiency -Continue ergocalciferol course -Will repeat 25-OHD level today  The best way to reach [facility] for lab results is [phone]  Follow-up:  Return in about 5 months  (around 01/22/2023).   >30 minutes spent today reviewing the medical chart, counseling the patient/family, and documenting today's encounter.   [name], MD  -------------------------------- 08/23/22 8:23 AM ADDENDUM: Results for orders placed or performed in visit on 08/22/22  T4, free  Result Value Ref Range   Free T4 1.3 0.8 - 1.8 ng/dL  T4  Result Value Ref Range   T4, Total 7.7 5.1 - 11.9 mcg/dL  TSH  Result Value Ref Range   TSH 0.69 mIU/L  VITAMIN D 25 Hydroxy (Vit-D Deficiency, Fractures)  Result Value Ref Range   Vit D, 25-Hydroxy 53 30 - 100 ng/mL   Will have nursing call Shelisa with the following  message: Your thyroid labs look great.  I don't think that you need the thyroid medicine at this point.  I would like for you to try stopping the levothyroxine for 2 weeks.  If you feel fine off of it, please do not restart it.  If you feel bad like you have in the past when we have tried to stop it, then you can restart it but just take half of a tablet (12.85mcg total) every other day (instead of every day).   Your vitamin D level looks great at 53 (we want it above 30).  You do not need to take the high dose vitamin D any longer.  We will repeat that level again at next visit to make sure it is staying up.  Please let me know if you have questions! Dr. Larinda Buttery

## 2022-08-23 ENCOUNTER — Telehealth (INDEPENDENT_AMBULATORY_CARE_PROVIDER_SITE_OTHER): Payer: Self-pay

## 2022-08-23 LAB — TSH: TSH: 0.69 mIU/L

## 2022-08-23 LAB — T4, FREE: Free T4: 1.3 ng/dL (ref 0.8–1.8)

## 2022-08-23 LAB — T4: T4, Total: 7.7 ug/dL (ref 5.1–11.9)

## 2022-08-23 LAB — VITAMIN D 25 HYDROXY (VIT D DEFICIENCY, FRACTURES): Vit D, 25-Hydroxy: 53 ng/mL (ref 30–100)

## 2022-08-23 NOTE — Telephone Encounter (Signed)
Relayed lab results to pt. She stated understanding had no further questions.

## 2022-08-23 NOTE — Telephone Encounter (Signed)
-----   Message from [name], MD sent at 08/23/2022  8:22 AM EDT ----- Nursing staff- please call the patient with this message. Thanks!   Your thyroid labs look great.  I don't think that you need the thyroid medicine at this point.  I would like for you to try stopping the levothyroxine for 2 weeks.  If you feel fine off of it, please do not restart it.  If you feel bad like you have in the past when we have tried to stop it, then you can restart it but just take half of a tablet (12.47mcg total) every other day (instead of every day).   Your vitamin D level looks great at 53 (we want it above 30).  You do not need to take the high dose vitamin D any longer.  We will repeat that level again at next visit to make sure it is staying up.  Please let me know if you have questions! Dr. Larinda Buttery

## 2022-09-01 ENCOUNTER — Encounter (HOSPITAL_BASED_OUTPATIENT_CLINIC_OR_DEPARTMENT_OTHER): Payer: Self-pay | Admitting: Emergency Medicine

## 2022-09-01 ENCOUNTER — Emergency Department (HOSPITAL_BASED_OUTPATIENT_CLINIC_OR_DEPARTMENT_OTHER): Payer: No Typology Code available for payment source

## 2022-09-01 ENCOUNTER — Other Ambulatory Visit: Payer: Self-pay

## 2022-09-01 ENCOUNTER — Emergency Department (HOSPITAL_BASED_OUTPATIENT_CLINIC_OR_DEPARTMENT_OTHER)
Admission: EM | Admit: 2022-09-01 | Discharge: 2022-09-01 | Disposition: A | Discharge status: Home / Self Care | Payer: No Typology Code available for payment source | Attending: Emergency Medicine | Admitting: Emergency Medicine

## 2022-09-01 DIAGNOSIS — W109XXA Fall (on) (from) unspecified stairs and steps, initial encounter: Secondary | ICD-10-CM | POA: Diagnosis not present

## 2022-09-01 DIAGNOSIS — S0990XA Unspecified injury of head, initial encounter: Secondary | ICD-10-CM | POA: Diagnosis not present

## 2022-09-01 LAB — CBC WITH DIFFERENTIAL/PLATELET
Abs Immature Granulocytes: 0.04 10*3/uL (ref 0.00–0.07)
Basophils Absolute: 0.1 10*3/uL (ref 0.0–0.1)
Basophils Relative: 1 %
Eosinophils Absolute: 0.1 10*3/uL (ref 0.0–0.5)
Eosinophils Relative: 1 %
HCT: 39.1 % (ref 36.0–46.0)
Hemoglobin: 12.9 g/dL (ref 12.0–15.0)
Immature Granulocytes: 1 %
Lymphocytes Relative: 13 %
Lymphs Abs: 1.1 10*3/uL (ref 0.7–4.0)
MCH: 28.7 pg (ref 26.0–34.0)
MCHC: 33 g/dL (ref 30.0–36.0)
MCV: 87.1 fL (ref 80.0–100.0)
Monocytes Absolute: 0.5 10*3/uL (ref 0.1–1.0)
Monocytes Relative: 5 %
Neutro Abs: 6.9 10*3/uL (ref 1.7–7.7)
Neutrophils Relative %: 79 %
Platelets: 215 10*3/uL (ref 150–400)
RBC: 4.49 MIL/uL (ref 3.87–5.11)
RDW: 12.6 % (ref 11.5–15.5)
WBC: 8.7 10*3/uL (ref 4.0–10.5)
nRBC: 0 % (ref 0.0–0.2)

## 2022-09-01 LAB — COMPREHENSIVE METABOLIC PANEL
ALT: 31 U/L (ref 0–44)
AST: 29 U/L (ref 15–41)
Albumin: 4.2 g/dL (ref 3.5–5.0)
Alkaline Phosphatase: 62 U/L (ref 38–126)
Anion gap: 6 (ref 5–15)
BUN: 8 mg/dL (ref 6–20)
CO2: 28 mmol/L (ref 22–32)
Calcium: 9.1 mg/dL (ref 8.9–10.3)
Chloride: 107 mmol/L (ref 98–111)
Creatinine, Ser: 0.65 mg/dL (ref 0.44–1.00)
GFR, Estimated: >60 mL/min (ref >60)
Glucose, Bld: 92 mg/dL (ref 70–99)
Potassium: 4.1 mmol/L (ref 3.5–5.1)
Sodium: 141 mmol/L (ref 135–145)
Total Bilirubin: 0.5 mg/dL (ref 0.3–1.2)
Total Protein: 7.2 g/dL (ref 6.5–8.1)

## 2022-09-01 LAB — LIPASE, BLOOD: Lipase: 22 U/L (ref 11–51)

## 2022-09-01 NOTE — ED Provider Notes (Signed)
[facility] EMERGENCY DEPARTMENT Provider Note   CSN: [id number] Arrival date & time: 09/01/22  1006     History Chief Complaint  Patient presents with  . Fall    HPI Jacqueline Green is a 21 y.o. female presenting for fall downstairs.  She states that she had multiple alcoholic beverages last night and "blacked out".  Her friend state that they think she fell downstairs.  She has bruising to her left eye.  She denies fevers or chills nausea vomiting syncope shortness of breath.  She does have a headache this morning as well as a black eye on her left eye. Patient otherwise ambulatory tolerating p.o. intake.  Patient's recorded medical, surgical, social, medication list and allergies were reviewed in the Snapshot window as part of the initial history.   Review of Systems   Review of Systems  Constitutional:  Negative for chills and fever.  HENT:  Negative for ear pain and sore throat.   Eyes:  Negative for pain and visual disturbance.  Respiratory:  Negative for cough and shortness of breath.   Cardiovascular:  Negative for chest pain and palpitations.  Gastrointestinal:  Negative for abdominal pain, nausea and vomiting.  Genitourinary:  Negative for dysuria and hematuria.  Musculoskeletal:  Negative for arthralgias and back pain.  Skin:  Negative for color change and rash.  Neurological:  Positive for headaches. Negative for seizures and syncope.  All other systems reviewed and are negative.   Physical Exam Updated Vital Signs BP 125/81 (BP Location: Right Arm)   Pulse 80   Temp 98.1 F (36.7 C) (Oral)   Resp 17   Ht 5' 2.76" (1.594 m)   Wt 50.3 kg   SpO2 98%   BMI 19.78 kg/m  Physical Exam Vitals and nursing note reviewed.  Constitutional:      General: She is not in acute distress.    Appearance: She is well-developed.  HENT:     Head: Normocephalic and atraumatic.  Eyes:     Conjunctiva/sclera: Conjunctivae normal.  Cardiovascular:     Rate and  Rhythm: Normal rate and regular rhythm.     Heart sounds: No murmur heard. Pulmonary:     Effort: Pulmonary effort is normal. No respiratory distress.     Breath sounds: Normal breath sounds.  Abdominal:     Palpations: Abdomen is soft.     Tenderness: There is no abdominal tenderness.  Musculoskeletal:        General: Signs of injury (Left eye bruising periorbitally.  Full range of motion of the eye appreciated.  No conjunctival injection.  No eye pain.) present. No swelling.     Cervical back: Neck supple.  Skin:    General: Skin is warm and dry.     Capillary Refill: Capillary refill takes less than 2 seconds.  Neurological:     Mental Status: She is alert.  Psychiatric:        Mood and Affect: Mood normal.     ED Course/ Medical Decision Making/ A&P    Procedures Procedures   Medications Ordered in ED Medications - No data to display Medical Decision Making:    [name] is a 21 y.o. female who presented to the ED today with a moderate mechanisma trauma, detailed above.    Patient's presentation is complicated by their history of ETOH use.   Given this mechanism of trauma, a full physical exam was performed. Notably, patient was hemodynamically stable in no acute distress.  She is ambulatory  tolerating p.o. intake..   Reviewed and confirmed nursing documentation for past medical history, family history, social history.    Initial Assessment/Plan:   This is a patient presenting with a moderate mechanism trauma.  As such, I have considered intracranial injuries including intracranial hemorrhage, intrathoracic injuries including blunt myocardial or blunt lung injury, blunt abdominal injuries including aortic dissection, bladder injury, spleen injury, liver injury and I have considered orthopedic injuries including extremity or spinal injury.  ***With the patient's presentation of moderate mechanism trauma and abnormalities detailed above, patient warrants aggressive  evaluation for potential traumatic injuries. Will proceed with non-level trauma protocol to evaluate for potential injuries. Will proceed with CT Head, Cervical/Thoracic/Lumbar Spine, and Chest/Abdomen/Pelvis with contrast. Scans resulted with ***. Discussed case with *** as a consult to assist with management of these injuries.    ***With the patient's presentation of moderate mechanism trauma but an otherwise reassuring exam, patient warrants targeted evaluation for potential traumatic injuries. Will proceed with targeted evaluation for potential injuries. Will proceed with ***. Objective evaluation resulted with ***.   ***With the patient's presentation of moderate mechanism trauma but with an otherwise reassuring exam, there is no indication for further objective evaluation at this time. Patient ambulating, tolerating PO intake and in no acute distress stable for continued OP care and management. Supportive care reinforced and patient is overall well appearing in no acute distress stable for continued OP care and management.   Final Reassessment and Plan:   ***    ***   Clinical Impression: No diagnosis found.   Data Unavailable   Final Clinical Impression(s) / ED Diagnoses Final diagnoses:  None    Rx / DC Orders ED Discharge Orders     None

## 2022-09-01 NOTE — ED Triage Notes (Signed)
Patient reports heavy drinking last night. Patient does not really remember last nights events but thinks she fell. Patient has abrasion to left eye.

## 2022-09-18 ENCOUNTER — Other Ambulatory Visit (INDEPENDENT_AMBULATORY_CARE_PROVIDER_SITE_OTHER): Payer: Self-pay | Admitting: Pediatrics

## 2022-09-18 DIAGNOSIS — E559 Vitamin D deficiency, unspecified: Secondary | ICD-10-CM

## 2023-01-14 ENCOUNTER — Telehealth (INDEPENDENT_AMBULATORY_CARE_PROVIDER_SITE_OTHER): Payer: Self-pay | Admitting: Pediatrics

## 2023-01-14 DIAGNOSIS — E063 Autoimmune thyroiditis: Secondary | ICD-10-CM

## 2023-01-14 DIAGNOSIS — E559 Vitamin D deficiency, unspecified: Secondary | ICD-10-CM

## 2023-01-14 NOTE — Telephone Encounter (Signed)
Will go ahead and order TSH, FT4, T3 and 25-OH vit D and A1c.  Will have nursing staff contact pt to let her know labs have been ordered and she can come to have them drawn.  Levon Hedger, MD

## 2023-01-14 NOTE — Telephone Encounter (Signed)
Returned call to patient to update that Dr. Charna Archer ordered labs and she can get them drawn.  She asked if she can come by today. I told her yes, that the lab is closed for lunch from 12:15 -1:15 and last draw is at 4 pm.

## 2023-01-14 NOTE — Telephone Encounter (Signed)
  Name of who is calling: Jacqueline Green Relationship to Patient: Self  Best contact number: [phone]  Provider they see: Dr.Jessup  Reason for call: [name] is calling because she's been feeling bad since she's been off of her medication. She's also been feeling tired. She has an appointment scheduled for next Wednesday January 31st and wants to know if she should come in this week to get lab work done. [name] is requesting a callback.      PRESCRIPTION REFILL ONLY  Name of prescription: levothyroxine  Pharmacy:

## 2023-01-15 LAB — HEMOGLOBIN A1C
Hgb A1c MFr Bld: 5.4 % of total Hgb (ref <5.7)
Mean Plasma Glucose: 108 mg/dL
eAG (mmol/L): 6 mmol/L

## 2023-01-15 LAB — T4, FREE: Free T4: 1.1 ng/dL (ref 0.8–1.8)

## 2023-01-15 LAB — VITAMIN D 25 HYDROXY (VIT D DEFICIENCY, FRACTURES): Vit D, 25-Hydroxy: 19 ng/mL — ABNORMAL LOW (ref 30–100)

## 2023-01-15 LAB — T3: T3, Total: 88 ng/dL (ref 76–181)

## 2023-01-15 LAB — TSH: TSH: 1.4 mIU/L

## 2023-01-22 ENCOUNTER — Ambulatory Visit (INDEPENDENT_AMBULATORY_CARE_PROVIDER_SITE_OTHER): Payer: No Typology Code available for payment source | Admitting: Pediatrics

## 2023-01-22 ENCOUNTER — Encounter (INDEPENDENT_AMBULATORY_CARE_PROVIDER_SITE_OTHER): Payer: Self-pay | Admitting: Pediatrics

## 2023-01-22 VITALS — BP 112/70 | HR 84 | Wt 131.2 lb

## 2023-01-22 DIAGNOSIS — E041 Nontoxic single thyroid nodule: Secondary | ICD-10-CM | POA: Diagnosis not present

## 2023-01-22 DIAGNOSIS — E559 Vitamin D deficiency, unspecified: Secondary | ICD-10-CM

## 2023-01-22 DIAGNOSIS — E063 Autoimmune thyroiditis: Secondary | ICD-10-CM

## 2023-01-22 MED ORDER — ERGOCALCIFEROL 1.25 MG (50000 UT) PO CAPS: 50000.0000 [IU] | ORAL_CAPSULE | ORAL | 0 refills | Status: DC

## 2023-01-22 MED ORDER — LEVOTHYROXINE SODIUM 25 MCG PO TABS: ORAL_TABLET | ORAL | 5 refills | Status: DC

## 2023-01-22 NOTE — Patient Instructions (Addendum)
It was a pleasure to see you in clinic today.   Feel free to contact our office during normal business hours at [phone] with questions or concerns. If you have an emergency after normal business hours, please call the above number to reach our answering service who will contact the on-call pediatric endocrinologist.  If you choose to communicate with [place] via [place], please do not send urgent messages as this inbox is NOT monitored on nights or weekends.  Urgent concerns should be discussed with the on-call pediatric endocrinologist.  Start levothyroxine 12.78mcg daily (half of a 50mcg tab)  Start taking ergocalciferol 50,000 units once a week.

## 2023-01-22 NOTE — Progress Notes (Addendum)
Pediatric Endocrinology Follow-up Visit  Chief Complaint: acquired hypothyroidism  HPI: Jacqueline Green is a 22 y.o. female presenting for follow-up of the above concerns.  she attended this visit alone.     1. Jacqueline Green initially presented to [facility] in 06/2015 after her PCP diagnosed primary hypothyroidism.  She had been seen by her PCP on 01/27/15, at which time she complained of weight gain, hair breakage, and fatigue.  Lifestyle modifications were recommended at that time to prevent weight gain.  She went back to her PCP in 03/2015 with similar symptoms (weight gain notably) after making diet changes and blood work was done.  Labs obtained 04/29/2015 showed TSH 124.9, free T4 0.2, hemoglobin A1c 5.6%, CMP normal except ALT slightly elevated at 36 (upper limit of normal 35).  Initial diagnosis of hypothyroidism is consistent with autoimmune hypothyroidism; on 06/07/2015 TSH was 71.542, FT4 0.85, TPO Ab >900, thyroglobulin Ab elevated at 7.  She was started on levothyroxine 41mg daily in 2016.  Her dose has been titrated since.  She was also noted to have a mildly low vitamin D level in the past (01/2018-25-OH D level 29); 1000 units vitamin D daily was recommended.    2. Since last visit on 08/22/22, she has been OK.  Got flu after last visit.  Felt very tired, attributed it to hte flu but has remained tired since stopping levothyroxine (stopped just prior to last visit with me).  Also had 2 eye infections since last visit. She also went to ED for concerns of severe abd pain and was diagnosed with diaphragm spasm.  Given a medicine x 1 that helped; rx sent to pharmacy that she never picked up as pain has not returned.  Thyroid symptoms: Not taking thyroid medicine currently (in the past was on brand name synthroid 12.551m daily)  Heat or cold intolerance: extremely cold Weight changes: Weight has increased 21lb since last visit. No changes in diet. Eats better now than in the past, goes to the gym. Energy  level: exhausted, "could sleep standing up".  Takes a nap in the afternoon/evening Sleep: sleeps like a rock, taking naps x 2 hours daily Constipation/Diarrhea:  No issues Difficulty swallowing: feels like she has enlarged lymph nodes in her neck frequently Periods regular: IUD  She had labs drawn in anticipation of today's visit; 01/14/23: TSH 1.4, FT4 1.1, T4 8.8, A1c 5.4%, 25-OH D 19  Vitamin D deficiency: Hx of Vit D defic, most recent level low at 19 Needs refill on ergocalciferol Doesn't drink milk often Gets a lot of sunlight  ROS: All systems reviewed with pertinent positives listed below; otherwise negative.   Past Medical History:   Past Medical History:  Diagnosis Date   Acquired autoimmune hypothyroidism    Dx 04/2015.  TSH 124, FT4 0.2. TPO ab > 900   Depression    GERD (gastroesophageal reflux disease)    Seasonal allergies    takes zyrtec and flonase prn   Meds: Outpatient Encounter Medications as of 01/22/2023  Medication Sig   Brimonidine Tartrate (LUMIFY) 0.025 % SOLN Place 1 drop into both eyes once a week.   levonorgestrel (KYLEENA) 19.5 MG IUD 1 each by Intrauterine route once.   Probiotic Product (ALIGN) 4 MG CAPS Take 4 mg by mouth daily.   tobramycin-dexamethasone (TOBRADEX) ophthalmic solution 1 drop 4 (four) times daily.   [DISCONTINUED] ergocalciferol (VITAMIN D2) 1.25 MG (50000 UT) capsule Take 1 capsule (50,000 Units total) by mouth once a week.   ergocalciferol (VITAMIN D2)  1.25 MG (50000 UT) capsule Take 1 capsule (50,000 Units total) by mouth once a week.   levothyroxine (SYNTHROID) 25 MCG tablet TAKE 1/2 TABLET(12.5 MCG) BY MOUTH DAILY   loperamide (IMODIUM A-D) 2 MG tablet Take 2 mg by mouth daily as needed for diarrhea or loose stools. (Patient not taking: Reported on 08/22/2022)   ondansetron (ZOFRAN) 4 MG tablet Take 1 tablet (4 mg total) by mouth every 4 (four) hours as needed for nausea or vomiting. (Patient not taking: Reported on 08/22/2022)    OVER THE COUNTER MEDICATION Place 1 Application rectally See admin instructions. Diltiazem 2%cr/Lidoc 2%oint (Compound)   APPLY PER RECTUM THREE TIMES DAILY AND 30 MINUTES PRIOR TO DEFECATION AS NEEDED RECTAL PAIN (Patient not taking: Reported on 08/22/2022)   Peppermint Oil (IBGARD) 90 MG CPCR Take 90 mg by mouth daily as needed (Stomach as needed). (Patient not taking: Reported on 08/22/2022)   sucralfate (CARAFATE) 1 g tablet Take 1 tablet (1 g total) by mouth 4 (four) times daily -  with meals and at bedtime for 7 days. (Patient taking differently: Take 1 g by mouth daily as needed (Stomach pain).)   [DISCONTINUED] levothyroxine (SYNTHROID) 25 MCG tablet TAKE 1/2 TABLET(12.5 MCG) BY MOUTH DAILY (Patient not taking: Reported on 01/22/2023)   No facility-administered encounter medications on file as of 01/22/2023.   Allergies: Allergies  Allergen Reactions   Strawberry (Diagnostic) Hives    Surgical History: Past Surgical History:  Procedure Laterality Date   CHOLECYSTECTOMY N/A 08/16/2022   Procedure: LAPAROSCOPIC CHOLECYSTECTOMY;  Surgeon: Dwan Bolt, MD;  Location: [facility];  Service: General;  Laterality: N/A;   none      Family History:  Family History  Problem Relation Age of Onset   Thyroid disease Mother        had benign tumor causing hyperthyroidism in 1991, underwent partial thyroidectomy.  Was on synthroid for years post-op, then around 2010 she was able to stop synthroid     Hypertension Father   Multiple maternal family members with hypothyroidism including MGM, 3 maternal aunts, and 1 maternal uncle.  Pt has cousin with T1DM, otherwise no other autoimmune diseases in the family  Social History: Lives on her own, near parents Works with horses.    Physical Exam:  Vitals:   01/22/23 0913  BP: 112/70  Pulse: 84  Weight: 131 lb 3.2 oz (59.5 kg)    BP 112/70 (BP Location: Left Arm, Patient Position: Sitting, Cuff Size: Large)   Pulse 84   Wt 131 lb 3.2 oz  (59.5 kg)   BMI 23.42 kg/m  Body mass index: body mass index is 23.42 kg/m. Growth %ile SmartLinks can only be used for patients less than 45 years old.  Wt Readings from Last 3 Encounters:  01/22/23 131 lb 3.2 oz (59.5 kg)  09/01/22 110 lb 12.8 oz (50.3 kg)  08/22/22 110 lb 12.8 oz (50.3 kg)   Ht Readings from Last 3 Encounters:  09/01/22 5' 2.76" (1.594 m)  08/22/22 5' 2.76" (1.594 m)  08/16/22 5' 1"$  (1.549 m)   Body mass index is 23.42 kg/m.  Facility age limit for growth %iles is 20 years. Facility age limit for growth %iles is 20 years.  General: Well developed, well nourished female in no acute distress.  Appears stated age Head: Normocephalic, atraumatic.   Eyes:  Pupils equal and round. EOMI.   Sclera white.  No eye drainage.   Ears/Nose/Mouth/Throat: Nares patent, no nasal drainage.  Moist mucous membranes, normal  dentition Neck: supple, no cervical lymphadenopathy, no thyromegaly, shoddy lymphadenopathy in neck Cardiovascular: regular rate, normal S1/S2, no murmurs Respiratory: No increased work of breathing.  Lungs clear to auscultation bilaterally.  No wheezes. Abdomen: soft, nontender, nondistended.  Extremities: warm, well perfused, cap refill < 2 sec.   Musculoskeletal: Normal muscle mass.  Normal strength Skin: warm, dry.  No rash or lesions. Neurologic: alert and oriented, normal speech, no tremor   Laboratory Evaluation: 06/07/15 TSH 71.5, free T4 0.85 09/22/15 TSH 35.812, FT4 1.02, TPO Ab >900, thyroglobulin Ab 7 (<2) 10/20/15 TSH 6.293, FT4 1.09 12/27/15: TSH 0.103, FT4 2.09 02/07/16: TSH 0.82, FT4 1.6, T4 11.1    Ref. Range 12/28/2020 10:23  Mean Plasma Glucose Latest Units: mg/dL 94  Vitamin D, 25-Hydroxy Latest Ref Range: 30 - 100 ng/mL 29 (L)  eAG (mmol/L) Latest Units: mmol/L 5.2  Hemoglobin A1C Latest Ref Range: <5.7 % of total Hgb 4.9  Glucose, Plasma Latest Ref Range: 65 - 139 mg/dL 82  TSH Latest Units: mIU/L 0.54  T4,Free(Direct) Latest Ref  Range: 0.8 - 1.4 ng/dL 1.1  Thyroxine (T4) Latest Ref Range: 5.3 - 11.7 mcg/dL 6.9    Labs drawn 08/31/2020 by PCP: TSH 0.23 (0.34-4.5) FT4 1.08 (0.61-1.12) FT3 2.72 (2.5-3.9) CMP unremarkable except gluc 55 CBC unremarkable A1c 5.3%   Latest Reference Range & Units 01/14/23 15:54  Vitamin D, 25-Hydroxy 30 - 100 ng/mL 19 (L)  eAG (mmol/L) mmol/L 6.0  Hemoglobin A1C <5.7 % of total Hgb 5.4  TSH mIU/L 1.40  Triiodothyronine (T3) 76 - 181 ng/dL 88  T4,Free(Direct) 0.8 - 1.8 ng/dL 1.1  (L): Data is abnormally low  --------------------------------  08/08/20 CLINICAL DATA:  22 year old female with a history of autoimmune hypothyroidism   EXAM: THYROID ULTRASOUND   TECHNIQUE: Ultrasound examination of the thyroid gland and adjacent soft tissues was performed.   COMPARISON:  None.   FINDINGS: Parenchymal Echotexture: Markedly heterogenous   Isthmus: 0.3 cm   Right lobe: 5.0 cm x 1.5 cm x 1.6 cm   Left lobe: 4.7 cm x 1.5 cm x 1.7 cm   _________________________________________________________   Estimated total number of nodules >/= 1 cm: 0   Number of spongiform nodules >/=  2 cm not described below (TR1): 0   Number of mixed cystic and solid nodules >/= 1.5 cm not described below ([place]): 0   _________________________________________________________   Nodule # 1:   Location: Right; Inferior   Maximum size: 0.9 cm; Other 2 dimensions: 0.8 cm x 0.4 cm   Composition: spongiform (0)   ACR TI-RADS recommendations:   Spongiform nodule does not meet criteria for surveillance or biopsy   _________________________________________________________   No adenopathy   IMPRESSION: Heterogeneous thyroid compatible with medical thyroid disease.   No thyroid nodule meets criteria for biopsy or surveillance, as designated by the newly established ACR TI-RADS criteria.   Recommendations follow those established by the new ACR TI-RADS criteria (J Am Coll Radiol  [id number]).     Electronically Signed   By: Corrie Mckusick D.O.   On: 08/09/2020 15:32    ------------------------------------  ASSESSMENT/PLAN: Jacqueline Green is a 22 y.o. female with autoimmune acquired hypothyroidism who is clinically hypothyroid (fatigue, weight gain) since stopping low dose levothyroxine treatment.  Her thyroid lab trend has been erratic (was requiring larger doses of levothyroxine then had weight loss and significant reduction of thyroid hormone needs). Goal of treatment is TSH in the lower half of the normal range with FT4/T4 in the upper half  of the normal range. Also she has vitamin D deficiency treated in the past with ergocalciferol; recent 25-OH vit D is low and she needs to resume ergocalciferol treatment.   Autoimmune Acquired hypothyroidism -Restart levothyroxine 12.90mg daily.  Reviewed thyroid labs.  Explained that this dose is extremely low and her labs are normal though since she felt better on this very small dose in the past, will restart it.  -Will repeat thyroid ultrasound given prior nodule.  2. Vitamin D deficiency -Restart ergocalciferol 50,000 units weekly x 12 weeks.  [phone] Follow-up:  Return in about 3 months (around 04/22/2023).   >40 minutes spent today reviewing the medical chart, counseling the patient/family, and documenting today's encounter.   ALevon Hedger MD  -------------------------------- 02/12/23 5:06 PM ADDENDUM:  CLINICAL DATA:  Prior ultrasound follow-up.   EXAM: THYROID ULTRASOUND performed 02/12/23   TECHNIQUE: Ultrasound examination of the thyroid gland and adjacent soft tissues was performed.   COMPARISON:  August 2021   FINDINGS: Parenchymal Echotexture: Moderately heterogenous   Isthmus: 0.4 cm   Right lobe: 4.7 x 1.7 x 1.5 cm   Left lobe: 5.0 x 1.6 x 1.4 cm   _________________________________________________________   Estimated  total number of nodules >/= 1 cm: 1   Number of spongiform nodules >/=  2 cm not described below (TR1): 0   Number of mixed cystic and solid nodules >/= 1.5 cm not described below (TR2): 0   _________________________________________________________   Nodule labeled 1 appears as a solid hypoechoic nodule with punctate echogenic foci (TR 5) in the inferior right thyroid lobe that measures 1.0 x 0.8 x 0.5 cm. Although remains similar in size measuring 0.9 cm on previous exam, it has been upgraded to TR 5 based on more conspicuous imaging features on today's exam. **Given size (>/= 1.0 cm) and appearance, fine needle aspiration of this highly suspicious nodule should be considered based on TI-RADS criteria.   IMPRESSION: 1. Heterogeneous appearance of the thyroid gland, compatible with history of medical thyroid disease. 2. Nodule labeled 1 in the right thyroid lobe (1.0 cm TR 5) meets criteria for biopsy. Note that although this nodule has remained similar in size compared to 2021, it has been upgraded to TR 5 based on more conspicuous sonographic features on today's exam, and now meets criteria for biopsy.   The above is in keeping with the ACR TI-RADS recommendations - J Am Coll Radiol 2017;14:587-595.     Electronically Signed   By: YAlbin FellingM.D.   On: 02/12/2023 12:31   Called [facility] discuss thyroid ultrasound results and need for FNA/biopsy.  Explained that it is reassuring that it has not changed in size, though given some of the features that were seen it needs to be biopsied.  Will order thyroid biopsy through [facility]   Advised to call me with questions.   ALevon Hedger MD

## 2023-02-12 ENCOUNTER — Ambulatory Visit
Admission: RE | Admit: 2023-02-12 | Discharge: 2023-02-12 | Disposition: A | Discharge status: Home / Self Care | Payer: Self-pay | Source: Ambulatory Visit | Attending: Pediatrics | Admitting: Pediatrics

## 2023-02-12 DIAGNOSIS — E041 Nontoxic single thyroid nodule: Secondary | ICD-10-CM | POA: Diagnosis not present

## 2023-02-12 DIAGNOSIS — E063 Autoimmune thyroiditis: Secondary | ICD-10-CM

## 2023-02-12 NOTE — Addendum Note (Signed)
Addended byJerelene Redden on: 02/12/2023 05:11 PM   Modules accepted: Orders

## 2023-03-04 ENCOUNTER — Ambulatory Visit
Admission: RE | Admit: 2023-03-04 | Discharge: 2023-03-04 | Disposition: A | Discharge status: Home / Self Care | Payer: Self-pay | Source: Ambulatory Visit | Attending: Pediatrics | Admitting: Pediatrics

## 2023-03-04 ENCOUNTER — Other Ambulatory Visit (HOSPITAL_COMMUNITY)
Admission: RE | Admit: 2023-03-04 | Discharge: 2023-03-04 | Disposition: A | Discharge status: Home / Self Care | Payer: Self-pay | Source: Ambulatory Visit | Attending: Interventional Radiology | Admitting: Interventional Radiology

## 2023-03-04 DIAGNOSIS — E041 Nontoxic single thyroid nodule: Secondary | ICD-10-CM | POA: Insufficient documentation

## 2023-03-04 DIAGNOSIS — E063 Autoimmune thyroiditis: Secondary | ICD-10-CM

## 2023-03-06 LAB — CYTOLOGY - NON PAP

## 2023-03-07 ENCOUNTER — Telehealth (INDEPENDENT_AMBULATORY_CARE_PROVIDER_SITE_OTHER): Payer: Self-pay

## 2023-03-07 ENCOUNTER — Telehealth (INDEPENDENT_AMBULATORY_CARE_PROVIDER_SITE_OTHER): Payer: Self-pay | Admitting: Pediatrics

## 2023-03-07 ENCOUNTER — Ambulatory Visit (INDEPENDENT_AMBULATORY_CARE_PROVIDER_SITE_OTHER): Payer: BC Managed Care – PPO | Admitting: Pediatrics

## 2023-03-07 DIAGNOSIS — C73 Malignant neoplasm of thyroid gland: Secondary | ICD-10-CM

## 2023-03-07 NOTE — Telephone Encounter (Signed)
  Name of who is calling:Michelle   Caller's Relationship to Patient:mother   Best contact number:[phone]  Provider they see:Dr. Charna Archer   Reason for call:mom called asking for a call back regarding Li's test results. She stated that Treanna called her and asked if she can call to get the results first  that she is a having hard time.      PRESCRIPTION REFILL ONLY  Name of prescription:  Pharmacy:

## 2023-03-07 NOTE — Telephone Encounter (Signed)
Faxed demographics, insurance card (front and back) last 2 progress notes, pathology lab, Thyroid labs, & last 3 thyroid U/S

## 2023-03-07 NOTE — Telephone Encounter (Signed)
Returned call to mom, updated that Dr. Charna Archer is not in the office to correspond with at this time, left HIPAA approved voicemail for return phone call.

## 2023-03-07 NOTE — Telephone Encounter (Signed)
Will have nursing call pt to schedule an appt to discuss thyroid biopsy results.  Jacqueline Hedger, MD

## 2023-03-07 NOTE — Progress Notes (Signed)
[id number]  Mom- [phone]  I brought Jacqueline Green and her mother to an in-person appt today to discuss biopsy results; these showed papillary thyroid carcinoma (see below).    Family understandably upset, asked appropriate questions.  Provided handout from [employer] regarding thyroid cancer. Family agreeable with me contacting [facility] for referral.  Spoke with [facility] to start referral; they requested the following be faxed to [phone]: -Pt demographics -Insurance info including copy of card if possible -All patient notes related to the diagnosis  -All imaging reports  -Pathology reports  Will have nursing staff fax this information.    Advised family to call me with any questions.  Family completed updated DPT form to allow mom to receive medical information.  Called [facility] to let her know that I have spoken with [facility] and have started the referral process.  Explained that they will be contacting her for an appointment once they verify that her insurance is accepted and that her appt will be with an endocrine oncology surgeon, at which time they will determine next steps, which will likely include surgery.    Cytology - Non PAP; RIGHT LOBE THYROID 03/04/23 Order: [id number]:[id number] Status: Edited Result - FINAL     V Dx: Thyroid nodule   0 Result Notes    Component 3 d ago  CYTOLOGY - NON GYN CYTOLOGY - NON PAP CASE: [id number] PATIENT: Jacqueline Green Non-Gynecological Cytology Report     Clinical History: Nodule labeled 1 appears as a solid hypoechoic nodule with punctate echogenic foci (TR 5) in the inferior right thyroid lobe that measures 1.0 x 0.8 x 0.5 cm. Although remains similar in size measuring 0.9 cm on previous exam, it has been upgraded to TR5 Specimen Submitted:  A. THYROID, RIGHT INFERIOR, FINE NEEDLE ASPIRATION:   FINAL MICROSCOPIC DIAGNOSIS: - Findings consistent with papillary carcinoma (Bethesda category  VI) - See comment  SPECIMEN ADEQUACY: Satisfactory for evaluation  DIAGNOSTIC COMMENTS: Afirma testing is available upon clinician request. Dr. Saralyn Pilar reviewed the case and agrees with the above diagnosis.  GROSS: Received is/are 30cc's of light peach color fluid in cytolyt solution and 6 slides ethyl alcohol. (TC:tc) Smears:6 Concentration Method (ThinPrep): 1 Cell Block: Cell block attempted but not obtained. Additional Studies: Afirma Collected.     Final Diagnosis performed by Maureen Ralphs, MD.   Electronically signed 03/06/2023 Technical and / or Professional components performed at [facility]. [facility], [place] [address], [place], [place] [address].  Immunohistochemistry Technical component (if applicable) was performed at [facility]. [address] [place], [place], [place] [address].   IMMUNOHISTOCHEMISTRY DISCLAIMER (if applicable): Some of these immunohistochemical stains may have been developed and the performance characteristics determine by [facility]. Some may not have been cleared or approved by the U.S. Food and Drug Administration. The FDA has determined that such clearance or approval is not necessary. This test is used for clinical purposes. It should not be regarded as investigational or for research. This laboratory is certified under the [place] (CLIA-88) as qualified to perform high complexity clinical laboratory testing.  The controls stained appropriately.      Levon Hedger, MD

## 2023-03-07 NOTE — Telephone Encounter (Signed)
Called patient, they are available to come in today.  Raquel Sarna notified to add to schedule

## 2023-03-07 NOTE — Telephone Encounter (Signed)
Mom called back, took call in [place] office on speaker phone with Raquel Sarna. She asked if she needed to bring her husband.  I told her I would read exactly what Dr. Charna Archer messaged me.  Reread Dr. Grover Canavan message that I read to [facility] and that I did not have any further information. I apologized for not have further details but that was all Dr. Charna Archer provided me.  Mom verbalized understanding.

## 2023-03-07 NOTE — Telephone Encounter (Signed)
-----   Message from Levon Hedger, MD sent at 03/07/2023  1:09 PM EDT ----- Please fax this information to the above number, today if possible. Thank you!

## 2023-03-11 NOTE — Telephone Encounter (Signed)
Called [facility] to follow up, they have received the referral and will be reaching out to the patient to schedule.

## 2023-03-24 DIAGNOSIS — Z01818 Encounter for other preprocedural examination: Secondary | ICD-10-CM | POA: Diagnosis not present

## 2023-03-24 DIAGNOSIS — C73 Malignant neoplasm of thyroid gland: Secondary | ICD-10-CM | POA: Diagnosis not present

## 2023-03-24 DIAGNOSIS — E559 Vitamin D deficiency, unspecified: Secondary | ICD-10-CM | POA: Diagnosis not present

## 2023-04-10 DIAGNOSIS — F17290 Nicotine dependence, other tobacco product, uncomplicated: Secondary | ICD-10-CM | POA: Insufficient documentation

## 2023-04-17 ENCOUNTER — Encounter (INDEPENDENT_AMBULATORY_CARE_PROVIDER_SITE_OTHER): Payer: Self-pay | Admitting: Pediatrics

## 2023-04-17 ENCOUNTER — Ambulatory Visit (INDEPENDENT_AMBULATORY_CARE_PROVIDER_SITE_OTHER): Payer: BC Managed Care – PPO | Admitting: Pediatrics

## 2023-04-17 VITALS — BP 116/72 | HR 68 | Wt 142.8 lb

## 2023-04-17 DIAGNOSIS — E559 Vitamin D deficiency, unspecified: Secondary | ICD-10-CM

## 2023-04-17 DIAGNOSIS — E063 Autoimmune thyroiditis: Secondary | ICD-10-CM

## 2023-04-17 DIAGNOSIS — C73 Malignant neoplasm of thyroid gland: Secondary | ICD-10-CM

## 2023-04-17 MED ORDER — LEVOTHYROXINE SODIUM 25 MCG PO TABS: 25.0000 ug | ORAL_TABLET | Freq: Every day | ORAL | 5 refills | Status: DC

## 2023-04-17 NOTE — Progress Notes (Signed)
Pediatric Endocrinology Follow-up Visit  Chief Complaint: acquired hypothyroidism, FNA consistent with PTC  HPI: Jacqueline Green is a 22 y.o. female presenting for follow-up of the above concerns.  she attended this visit alone.     1. Jacqueline Green initially presented to [facility] in 06/2015 after her PCP diagnosed primary hypothyroidism.  She had been seen by her PCP on 01/27/15, at which time she complained of weight gain, hair breakage, and fatigue.  Lifestyle modifications were recommended at that time to prevent weight gain.  She went back to her PCP in 03/2015 with similar symptoms (weight gain notably) after making diet changes and blood work was done.  Labs obtained 04/29/2015 showed TSH 124.9, free T4 0.2, hemoglobin A1c 5.6%, CMP normal except ALT slightly elevated at 36 (upper limit of normal 35).  Initial diagnosis of hypothyroidism is consistent with autoimmune hypothyroidism; on 06/07/2015 TSH was 71.542, FT4 0.85, TPO Ab >900, thyroglobulin Ab elevated at 7.  She was started on levothyroxine daily in 2016.  Her dose has been titrated since.  She was also noted to have a mildly low vitamin D level in the past (01/2018-25-OH D level 29); 1000 units vitamin D daily was recommended.  She had a follow-up thyroid ultrasound 01/2023 that showed a suspicious nodule; FNA showed PTC and she has been referred to [facility] for further management.  2. Since last visit on 01/22/23, she has been OK.  Went to [facility]- hemithyroidectomy scheduled for 05/21/23 with follow-up 2 weeks later  Gaining weight.  Trying to stop smoking though it is not going well. Feeling more tired recently  Had labs drawn at [facility] on 03/24/2023- TSH 1.21, FT4 0.82.  Thyroid symptoms: Taking brand name synthroid 12. daily Missed: None  Heat or cold intolerance: neither Weight changes: Weight has increased 11lb since last visit.   Energy level: lower recently Sleep: ok Constipation/Diarrhea:  No issues Difficulty swallowing:  feels tighter when she swallows.  Feels a lymph node on the left side of neck recently though it is soft and mobile. Had ultrasound at [facility] to evaluate lymph nodes and none were concerning per [facility].  Surgeon will take some lymph nodes to see if they are clear Periods regular: IUD  Vitamin D deficiency: Hx of Vit D defic, most recent level low at 19 Taking ergocalciferol 50,000 units weekly Dairy: Eats greek yogurt Sun exposure: yes  ROS: All systems reviewed with pertinent positives listed below; otherwise negative.   Past Medical History:   Past Medical History:  Diagnosis Date   Acquired autoimmune hypothyroidism    Dx 04/2015.  TSH 124, FT4 0.2. TPO ab > 900   Depression    GERD (gastroesophageal reflux disease)    Seasonal allergies    takes zyrtec and flonase prn   Meds: Outpatient Encounter Medications as of 04/17/2023  Medication Sig   ergocalciferol (VITAMIN D2) 1.25 MG (50000 UT) capsule Take 1 capsule (50,000 Units total) by mouth once a week.   levonorgestrel (KYLEENA) 19.5 MG IUD 1 each by Intrauterine route once.   [DISCONTINUED] levothyroxine (SYNTHROID) 25 MCG tablet TAKE 1/2 TABLET(12.5 MCG) BY MOUTH DAILY   levothyroxine (SYNTHROID) 25 MCG tablet Take 1 tablet (25 mcg total) by mouth daily.   ondansetron (ZOFRAN) 4 MG tablet Take 1 tablet (4 mg total) by mouth every 4 (four) hours as needed for nausea or vomiting. (Patient not taking: Reported on 08/22/2022)   Probiotic Product (ALIGN) 4 MG CAPS Take 4 mg by mouth daily. (Patient not  taking: Reported on 04/17/2023)   sucralfate (CARAFATE) 1 g tablet Take 1 tablet (1 g total) by mouth 4 (four) times daily -  with meals and at bedtime for 7 days. (Patient taking differently: Take 1 g by mouth daily as needed (Stomach pain).)   tobramycin-dexamethasone (TOBRADEX) ophthalmic solution 1 drop 4 (four) times daily. (Patient not taking: Reported on 04/17/2023)   varenicline (CHANTIX) 1 MG tablet Take by mouth. (Patient not  taking: Reported on 04/17/2023)   [DISCONTINUED] Brimonidine Tartrate (LUMIFY) 0.025 % SOLN Place 1 drop into both eyes once a week. (Patient not taking: Reported on 04/17/2023)   [DISCONTINUED] loperamide (IMODIUM A-D) 2 MG tablet Take 2 mg by mouth daily as needed for diarrhea or loose stools. (Patient not taking: Reported on 08/22/2022)   [DISCONTINUED] OVER THE COUNTER MEDICATION Place 1 Application rectally See admin instructions. Diltiazem 2%cr/Lidoc 2%oint (Compound)   APPLY PER RECTUM THREE TIMES DAILY AND 30 MINUTES PRIOR TO DEFECATION AS NEEDED RECTAL PAIN (Patient not taking: Reported on 08/22/2022)   [DISCONTINUED] Peppermint Oil (IBGARD) 90 MG CPCR Take 90 mg by mouth daily as needed (Stomach as needed). (Patient not taking: Reported on 08/22/2022)   No facility-administered encounter medications on file as of 04/17/2023.   Allergies: Allergies  Allergen Reactions   Strawberry (Diagnostic) Hives    Surgical History: Past Surgical History:  Procedure Laterality Date   CHOLECYSTECTOMY N/A 08/16/2022   Procedure: LAPAROSCOPIC CHOLECYSTECTOMY;  Surgeon: Fritzi Mandes, MD;  Location: [facility] OR;  Service: General;  Laterality: N/A;   none      Family History:  Family History  Problem Relation Age of Onset   Thyroid disease Mother        had benign tumor causing hyperthyroidism in 1991, underwent partial thyroidectomy.  Was on synthroid for years post-op, then around 2010 she was able to stop synthroid     Hypertension Father   Multiple maternal family members with hypothyroidism including MGM, 3 maternal aunts, and 1 maternal uncle.  Pt has cousin with T1DM, otherwise no other autoimmune diseases in the family  Social History: Lives on her own, near parents Works with horses.    Physical Exam:  Vitals:   04/17/23 0916  BP: 116/72  Pulse: 68  Weight: 142 lb 12.8 oz (64.8 kg)    BP 116/72 (BP Location: Left Arm, Patient Position: Sitting, Cuff Size: Large)   Pulse 68   Wt  142 lb 12.8 oz (64.8 kg)   LMP  (Exact Date) Comment: no period with IUD  BMI 25.49 kg/m  Body mass index: body mass index is 25.49 kg/m. Growth %ile SmartLinks can only be used for patients less than 48 years old.  Wt Readings from Last 3 Encounters:  04/17/23 142 lb 12.8 oz (64.8 kg)  01/22/23 131 lb 3.2 oz (59.5 kg)  09/01/22 110 lb 12.8 oz (50.3 kg)   Ht Readings from Last 3 Encounters:  09/01/22 5' 2.76" (1.594 m)  08/22/22 5' 2.76" (1.594 m)  08/16/22  (1.549 m)   Body mass index is 25.49 kg/m.  Facility age limit for growth %iles is 20 years. Facility age limit for growth %iles is 20 years.  General: Well developed, well nourished female in no acute distress.  Appears stated age Head: Normocephalic, atraumatic.   Eyes:  Pupils equal and round. EOMI.   Sclera white.  No eye drainage.   Ears/Nose/Mouth/Throat: Nares patent, no nasal drainage.  Moist mucous membranes, normal dentition Neck: supple, no cervical lymphadenopathy, no  thyromegaly, soft small mobile lymph node palpated just below L ear.   Cardiovascular: regular rate, normal S1/S2, no murmurs Respiratory: No increased work of breathing.  Lungs clear to auscultation bilaterally.  No wheezes. Abdomen: soft, nontender, nondistended.  Extremities: warm, well perfused, cap refill < 2 sec.   Musculoskeletal: Normal muscle mass.  Normal strength Skin: warm, dry.  No rash or lesions. Neurologic: alert and oriented, normal speech, no tremor   Laboratory Evaluation: 06/07/15 TSH 71.5, free T4 0.85 09/22/15 TSH 35.812, FT4 1.02, TPO Ab >900, thyroglobulin Ab 7 (<2) 10/20/15 TSH 6.293, FT4 1.09 12/27/15: TSH 0.103, FT4 2.09 02/07/16: TSH 0.82, FT4 1.6, T4 11.1    Ref. Range 12/28/2020 10:23  Mean Plasma Glucose Latest Units: mg/dL 94  Vitamin D, 16-XWRUEAV Latest Ref Range: 30 - 100 ng/mL 29 (L)  eAG (mmol/L) Latest Units: mmol/L 5.2  Hemoglobin A1C Latest Ref Range: <5.7 % of total Hgb 4.9  Glucose, Plasma Latest  Ref Range: 65 - 139 mg/dL 82  TSH Latest Units: mIU/L 0.54  T4,Free(Direct) Latest Ref Range: 0.8 - 1.4 ng/dL 1.1  Thyroxine (T4) Latest Ref Range: 5.3 - 11.7 mcg/dL 6.9    Labs drawn 4/0/9811 by PCP: TSH 0.23 (0.34-4.5) FT4 1.08 (0.61-1.12) FT3 2.72 (2.5-3.9) CMP unremarkable except gluc 55 CBC unremarkable A1c 5.3%   Latest Reference Range & Units 01/14/23 15:54  Vitamin D, 25-Hydroxy 30 - 100 ng/mL 19 (L)  eAG (mmol/L) mmol/L 6.0  Hemoglobin A1C <5.7 % of total Hgb 5.4  TSH mIU/L 1.40  Triiodothyronine (T3) 76 - 181 ng/dL 88  B1,YNWG(NFAOZH) 0.8 - 1.8 ng/dL 1.1  (L): Data is abnormally low  --------------------------------  08/08/20 CLINICAL DATA:  22 year old female with a history of autoimmune hypothyroidism   EXAM: THYROID ULTRASOUND   TECHNIQUE: Ultrasound examination of the thyroid gland and adjacent soft tissues was performed.   COMPARISON:  None.   FINDINGS: Parenchymal Echotexture: Markedly heterogenous   Isthmus: 0.3 cm   Right lobe: 5.0 cm x 1.5 cm x 1.6 cm   Left lobe: 4.7 cm x 1.5 cm x 1.7 cm   _________________________________________________________   Estimated total number of nodules >/= 1 cm: 0   Number of spongiform nodules >/=  2 cm not described below (TR1): 0   Number of mixed cystic and solid nodules >/= 1.5 cm not described below (TR2): 0   _________________________________________________________   Nodule # 1:   Location: Right; Inferior   Maximum size: 0.9 cm; Other 2 dimensions: 0.8 cm x 0.4 cm   Composition: spongiform (0)   ACR TI-RADS recommendations:   Spongiform nodule does not meet criteria for surveillance or biopsy   _________________________________________________________   No adenopathy   IMPRESSION: Heterogeneous thyroid compatible with medical thyroid disease.   No thyroid nodule meets criteria for biopsy or surveillance, as designated by the newly established ACR TI-RADS criteria.   Recommendations  follow those established by the new ACR TI-RADS criteria (J Am Coll Radiol 2017;14:587-595).     Electronically Signed   By: Gilmer Mor D.O.   On: 08/09/2020 15:32    ------------------------------------   EXAM: THYROID ULTRASOUND performed 02/12/23   TECHNIQUE: Ultrasound examination of the thyroid gland and adjacent soft tissues was performed.   COMPARISON:  August 2021   FINDINGS: Parenchymal Echotexture: Moderately heterogenous   Isthmus: 0.4 cm   Right lobe: 4.7 x 1.7 x 1.5 cm   Left lobe: 5.0 x 1.6 x 1.4 cm   _________________________________________________________   Estimated total number of  nodules >/= 1 cm: 1   Number of spongiform nodules >/=  2 cm not described below (TR1): 0   Number of mixed cystic and solid nodules >/= 1.5 cm not described below (TR2): 0   _________________________________________________________   Nodule labeled 1 appears as a solid hypoechoic nodule with punctate echogenic foci (TR 5) in the inferior right thyroid lobe that measures 1.0 x 0.8 x 0.5 cm. Although remains similar in size measuring 0.9 cm on previous exam, it has been upgraded to TR 5 based on more conspicuous imaging features on today's exam. **Given size (>/= 1.0 cm) and appearance, fine needle aspiration of this highly suspicious nodule should be considered based on TI-RADS criteria.   IMPRESSION: 1. Heterogeneous appearance of the thyroid gland, compatible with history of medical thyroid disease. 2. Nodule labeled 1 in the right thyroid lobe (1.0 cm TR 5) meets criteria for biopsy. Note that although this nodule has remained similar in size compared to 2021, it has been upgraded to TR 5 based on more conspicuous sonographic features on today's exam, and now meets criteria for biopsy.   The above is in keeping with the ACR TI-RADS recommendations - J Am Coll Radiol 2017;14:587-595.     Electronically Signed   By: Olive Bass M.D.   On: 02/12/2023  12:31 ----------------------------------------------------------------------------------------------------------- 03/04/23 R thyroid nodule:    Component 1 mo ago  CYTOLOGY - NON GYN CYTOLOGY - NON PAP CASE: [id number] PATIENT: Infantof Uddin Non-Gynecological Cytology Report     Clinical History: Nodule labeled 1 appears as a solid hypoechoic nodule with punctate echogenic foci (TR 5) in the inferior right thyroid lobe that measures 1.0 x 0.8 x 0.5 cm. Although remains similar in size measuring 0.9 cm on previous exam, it has been upgraded to TR5 Specimen Submitted:  A. 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 female with autoimmune acquired hypothyroidism who is clinically hypothyroid (fatigue, weight gain) on very low dose levothyroxine.  She is biochemically euthyroid with room to increase her dose slightly.  Her thyroid lab trend has been erratic in the past (was requiring larger doses of levothyroxine then had weight loss and significant reduction of thyroid hormone needs). She has a 1cm nodule consistent with PTC and is scheduled for partial thyroide[facility] on 05/21/2023 at [facility].  She also has vitamin D deficiency treated with ergocalciferol.  Autoimmune Acquired hypothyroidism -Increase levothyroxine to daily.  New rx sent. -Will schedule follow-up with me the week after her post-op appt.  2. Vitamin D deficiency -Continue ergocalciferol 50,000 units weekly  The best way to reach [facility] for lab results is [phone]  Follow-up:  Return in about 7 weeks (around 06/05/2023).   >40 minutes spent today reviewing the medical chart, counseling the patient/family, and documenting today's encounter.   [name], MD

## 2023-04-17 NOTE — Patient Instructions (Addendum)
It was a pleasure to see you in clinic today.   Feel free to contact our office during normal business hours at [phone] with questions or concerns. If you have an emergency after normal business hours, please call the above number to reach our answering service who will contact the on-call pediatric endocrinologist.  If you choose to communicate with [place] via MyChart, please do not send urgent messages as this inbox is NOT monitored on nights or weekends.  Urgent concerns should be discussed with the on-call pediatric endocrinologist.  Increase your synthroid to 1 pill daily ( ).  Continue taking vitamin D

## 2023-05-21 DIAGNOSIS — C73 Malignant neoplasm of thyroid gland: Secondary | ICD-10-CM | POA: Insufficient documentation

## 2023-05-21 DIAGNOSIS — Z79899 Other long term (current) drug therapy: Secondary | ICD-10-CM | POA: Diagnosis not present

## 2023-05-21 DIAGNOSIS — E063 Autoimmune thyroiditis: Secondary | ICD-10-CM | POA: Diagnosis not present

## 2023-05-21 DIAGNOSIS — Z87891 Personal history of nicotine dependence: Secondary | ICD-10-CM | POA: Diagnosis not present

## 2023-05-21 HISTORY — PX: THYROIDECTOMY: SHX17

## 2023-05-22 ENCOUNTER — Telehealth (INDEPENDENT_AMBULATORY_CARE_PROVIDER_SITE_OTHER): Payer: Self-pay | Admitting: Pediatrics

## 2023-05-22 NOTE — Telephone Encounter (Signed)
I called Jacqueline Green's mother to check on her.  She had her thyroid lobectomy yesterday.  She ended up staying overnight at the hospital and is on the way home with her mother now.  They will be going to the beach for the next 2 weeks so she can recover.  She is hoarse now and can only talk in a whisper per mom.  She has post-op follow-up scheduled with [facility].  [name], MD

## 2023-06-04 DIAGNOSIS — E890 Postprocedural hypothyroidism: Secondary | ICD-10-CM | POA: Diagnosis not present

## 2023-06-04 DIAGNOSIS — C73 Malignant neoplasm of thyroid gland: Secondary | ICD-10-CM | POA: Diagnosis not present

## 2023-06-05 ENCOUNTER — Ambulatory Visit (INDEPENDENT_AMBULATORY_CARE_PROVIDER_SITE_OTHER): Payer: BC Managed Care – PPO | Admitting: Pediatrics

## 2023-06-05 ENCOUNTER — Encounter (INDEPENDENT_AMBULATORY_CARE_PROVIDER_SITE_OTHER): Payer: Self-pay | Admitting: Pediatrics

## 2023-06-05 VITALS — BP 110/70 | HR 86 | Wt 141.6 lb

## 2023-06-05 DIAGNOSIS — C73 Malignant neoplasm of thyroid gland: Secondary | ICD-10-CM | POA: Diagnosis not present

## 2023-06-05 DIAGNOSIS — E063 Autoimmune thyroiditis: Secondary | ICD-10-CM | POA: Diagnosis not present

## 2023-06-05 DIAGNOSIS — E559 Vitamin D deficiency, unspecified: Secondary | ICD-10-CM

## 2023-06-05 NOTE — Patient Instructions (Addendum)
It was a pleasure to see you in clinic today.   Feel free to contact our office during normal business hours at [phone] with questions or concerns. If you have an emergency after normal business hours, please call the above number to reach our answering service who will contact the on-call pediatric endocrinologist.  If you choose to communicate with us via MyChart, please do not send urgent messages as this inbox is NOT monitored on nights or weekends.  Urgent concerns should be discussed with the on-call pediatric endocrinologist.  Please go to the following address to have labs drawn after today's visit: [address] [place], [place] [address]  Or   [address], [address]  

## 2023-06-05 NOTE — Progress Notes (Addendum)
Pediatric Endocrinology Follow-up Visit  Chief Complaint: acquired hypothyroidism, papillary thyroid cancer s/p thyroid lobectomy  HPI: Jacqueline Green is a 22 y.o. female presenting for follow-up of the above concerns.  she attended this visit alone.  1. Jacqueline Green initially presented to [facility] in 06/2015 after her PCP diagnosed primary hypothyroidism.  She had been seen by her PCP on 01/27/15, at which time she complained of weight gain, hair breakage, and fatigue.  Lifestyle modifications were recommended at that time to prevent weight gain.  She went back to her PCP in 03/2015 with similar symptoms (weight gain notably) after making diet changes and blood work was done.  Labs obtained 04/29/2015 showed TSH 124.9, free T4 0.2, hemoglobin A1c 5.6%, CMP normal except ALT slightly elevated at 36 (upper limit of normal 35).  Initial diagnosis of hypothyroidism is consistent with autoimmune hypothyroidism; on 06/07/2015 TSH was 71.542, FT4 0.85, TPO Ab >900, thyroglobulin Ab elevated at 7.  She was started on levothyroxine daily in 2016.  Her dose has been titrated since.  She was also noted to have a mildly low vitamin D level in the past (01/2018-25-OH D level 29); 1000 units vitamin D daily was recommended.  She had a follow-up thyroid ultrasound 01/2023 that showed a suspicious nodule; FNA showed PTC and she has been referred to [facility] for further management.  She is s/p R thyroid lobectomy for PTC.  2. Since last visit on 04/17/23, she has been OK.  She underwent R thyroid lobectomy on 05/21/23 and path showed 1.1cm papillary thyroid carcinoma and an additional 0.1cm focus of papillary thyroid carcinoma.  No lymph node involvement.  See below for pathology report.  According to her surgical follow-up note:  "Based on ATA risk-stratification (Haugen 2016; see schematic figure) the patient is at low risk for recurrence. She will need continued surveillance and monitoring TFTs with her local endocrinologist. She has a  follow up appointment with Dr. Larinda Buttery tomorrow.  Assessment of thyroid economy: She will need thyroid function tests checked in approximately 6 weeks after surgery. I have advised her to follow up with her endocrinologist or PCP for this. "  [name] reports feeling well after her surgery.  She is worried that her synthroid dose needs adjusted because she is tired all the time.    Thyroid symptoms: Taking brand name synthroid daily Missed: none  Heat or cold intolerance: has been hot lately Weight changes: Weight has Decreased 1lb since last visit.   Energy level: has been more tired recently.  Sleep: good Constipation/Diarrhea: none Difficulty swallowing: None Periods regular: IUD  Vitamin D deficiency: Hx of Vit D defic, most recent level low at 19 Taking ergocalciferol 50,000 units weekly Eats 2 containers of yogurt daily Has limited sun exposure due to need to stay out of direct sunlight for 2 weeks after surgery.  ROS: All systems reviewed with pertinent positives listed below; otherwise negative.   Past Medical History:   Past Medical History:  Diagnosis Date   Acquired autoimmune hypothyroidism    Dx 04/2015.  TSH 124, FT4 0.2. TPO ab > 900   Depression    GERD (gastroesophageal reflux disease)    Seasonal allergies    takes zyrtec and flonase prn   Meds: Outpatient Encounter Medications as of 06/05/2023  Medication Sig   ergocalciferol (VITAMIN D2) 1.25 MG (50000 UT) capsule Take 1 capsule (50,000 Units total) by mouth once a week.   levonorgestrel (KYLEENA) 19.5 MG IUD 1 each by Intrauterine route once.  levothyroxine (SYNTHROID) 25 MCG tablet Take 1 tablet (25 mcg total) by mouth daily.   ondansetron (ZOFRAN) 4 MG tablet Take 1 tablet (4 mg total) by mouth every 4 (four) hours as needed for nausea or vomiting. (Patient not taking: Reported on 08/22/2022)   Probiotic Product (ALIGN) 4 MG CAPS Take 4 mg by mouth daily. (Patient not taking: Reported on 04/17/2023)    sucralfate (CARAFATE) 1 g tablet Take 1 tablet (1 g total) by mouth 4 (four) times daily -  with meals and at bedtime for 7 days. (Patient taking differently: Take 1 g by mouth daily as needed (Stomach pain).)   tobramycin-dexamethasone (TOBRADEX) ophthalmic solution 1 drop 4 (four) times daily. (Patient not taking: Reported on 04/17/2023)   varenicline (CHANTIX) 1 MG tablet Take by mouth. (Patient not taking: Reported on 04/17/2023)   No facility-administered encounter medications on file as of 06/05/2023.   Allergies: Allergies  Allergen Reactions   Strawberry (Diagnostic) Hives   Surgical History: Past Surgical History:  Procedure Laterality Date   CHOLECYSTECTOMY N/A 08/16/2022   Procedure: LAPAROSCOPIC CHOLECYSTECTOMY;  Surgeon: Fritzi Mandes, MD;  Location: [facility] OR;  Service: General;  Laterality: N/A;   none     THYROIDECTOMY Right 05/21/2023    Family History:  Family History  Problem Relation Age of Onset   Thyroid disease Mother        had benign tumor causing hyperthyroidism in 1991, underwent partial thyroidectomy.  Was on synthroid for years post-op, then around 2010 she was able to stop synthroid     Hypertension Father   Multiple maternal family members with hypothyroidism including MGM, 3 maternal aunts, and 1 maternal uncle.  Pt has cousin with T1DM, otherwise no other autoimmune diseases in the family  Social History: Lives on her own, near parents Works with horses.   No longer smoking.  Physical Exam:  Vitals:   06/05/23 0858  BP: 110/70  Pulse: 86  Weight: 141 lb 9.6 oz (64.2 kg)   BP 110/70   Pulse 86   Wt 141 lb 9.6 oz (64.2 kg)   BMI 25.28 kg/m  Body mass index: body mass index is 25.28 kg/m. Growth %ile SmartLinks can only be used for patients less than 39 years old.  Wt Readings from Last 3 Encounters:  06/05/23 141 lb 9.6 oz (64.2 kg)  04/17/23 142 lb 12.8 oz (64.8 kg)  01/22/23 131 lb 3.2 oz (59.5 kg)   Ht Readings from Last 3  Encounters:  09/01/22 5' 2.76" (1.594 m)  08/22/22 5' 2.76" (1.594 m)  08/16/22 5\' 1"  (1.549 m)   Body mass index is 25.28 kg/m.  Facility age limit for growth %iles is 20 years. Facility age limit for growth %iles is 20 years.  General: Well developed, well nourished female in no acute distress.  Appears stated age.  Voice not hoarse when speaking at a normal volume Head: Normocephalic, atraumatic.   Eyes:  Pupils equal and round. EOMI.   Sclera white.  No eye drainage.   Ears/Nose/Mouth/Throat: Nares patent, no nasal drainage.  Moist mucous membranes, normal dentition Neck: supple, no cervical lymphadenopathy, healing surgical incision at the base of her neck.  No redness or drainage from incision site Cardiovascular: regular rate, normal S1/S2, no murmurs Respiratory: No increased work of breathing.  Lungs clear to auscultation bilaterally.  No wheezes. Abdomen: soft, nontender, nondistended.  Extremities: warm, well perfused, cap refill < 2 sec.   Musculoskeletal: Normal muscle mass.  Normal strength Skin: warm,  dry.  No rash. Neurologic: alert and oriented, normal speech, no tremor   Laboratory Evaluation: 06/07/15 TSH 71.5, free T4 0.85 09/22/15 TSH 35.812, FT4 1.02, TPO Ab >900, thyroglobulin Ab 7 (<2) 10/20/15 TSH 6.293, FT4 1.09 12/27/15: TSH 0.103, FT4 2.09 02/07/16: TSH 0.82, FT4 1.6, T4 11.1    Ref. Range 12/28/2020 10:23  Mean Plasma Glucose Latest Units: mg/dL 94  Vitamin D, 16-XWRUEAV Latest Ref Range: 30 - 100 ng/mL 29 (L)  eAG (mmol/L) Latest Units: mmol/L 5.2  Hemoglobin A1C Latest Ref Range: <5.7 % of total Hgb 4.9  Glucose, Plasma Latest Ref Range: 65 - 139 mg/dL 82  TSH Latest Units: mIU/L 0.54  T4,Free(Direct) Latest Ref Range: 0.8 - 1.4 ng/dL 1.1  Thyroxine (T4) Latest Ref Range: 5.3 - 11.7 mcg/dL 6.9    Labs drawn 4/0/9811 by PCP: TSH 0.23 (0.34-4.5) FT4 1.08 (0.61-1.12) FT3 2.72 (2.5-3.9) CMP unremarkable except gluc 55 CBC unremarkable A1c 5.3%    Latest Reference Range & Units 01/14/23 15:54  Vitamin D, 25-Hydroxy 30 - 100 ng/mL 19 (L)  eAG (mmol/L) mmol/L 6.0  Hemoglobin A1C <5.7 % of total Hgb 5.4  TSH mIU/L 1.40  Triiodothyronine (T3) 76 - 181 ng/dL 88  B1,YNWG(NFAOZH) 0.8 - 1.8 ng/dL 1.1  (L): Data is abnormally low  --------------------------------  08/08/20 CLINICAL DATA:  21 year old female with a history of autoimmune hypothyroidism   EXAM: THYROID ULTRASOUND   TECHNIQUE: Ultrasound examination of the thyroid gland and adjacent soft tissues was performed.   COMPARISON:  None.   FINDINGS: Parenchymal Echotexture: Markedly heterogenous   Isthmus: 0.3 cm   Right lobe: 5.0 cm x 1.5 cm x 1.6 cm   Left lobe: 4.7 cm x 1.5 cm x 1.7 cm   _________________________________________________________   Estimated total number of nodules >/= 1 cm: 0   Number of spongiform nodules >/=  2 cm not described below (TR1): 0   Number of mixed cystic and solid nodules >/= 1.5 cm not described below (TR2): 0   _________________________________________________________   Nodule # 1:   Location: Right; Inferior   Maximum size: 0.9 cm; Other 2 dimensions: 0.8 cm x 0.4 cm   Composition: spongiform (0)   ACR TI-RADS recommendations:   Spongiform nodule does not meet criteria for surveillance or biopsy   _________________________________________________________   No adenopathy   IMPRESSION: Heterogeneous thyroid compatible with medical thyroid disease.   No thyroid nodule meets criteria for biopsy or surveillance, as designated by the newly established ACR TI-RADS criteria.   Recommendations follow those established by the new ACR TI-RADS criteria (J Am Coll Radiol 2017;14:587-595).     Electronically Signed   By: Gilmer Mor D.O.   On: 08/09/2020 15:32    ------------------------------------   EXAM: THYROID ULTRASOUND performed 02/12/23   TECHNIQUE: Ultrasound examination of the thyroid gland and adjacent  soft tissues was performed.   COMPARISON:  August 2021   FINDINGS: Parenchymal Echotexture: Moderately heterogenous   Isthmus: 0.4 cm   Right lobe: 4.7 x 1.7 x 1.5 cm   Left lobe: 5.0 x 1.6 x 1.4 cm   _________________________________________________________   Estimated total number of nodules >/= 1 cm: 1   Number of spongiform nodules >/=  2 cm not described below (TR1): 0   Number of mixed cystic and solid nodules >/= 1.5 cm not described below (TR2): 0   _________________________________________________________   Nodule labeled 1 appears as a solid hypoechoic nodule with punctate echogenic foci (TR 5) in the inferior right thyroid  lobe that measures 1.0 x 0.8 x 0.5 cm. Although remains similar in size measuring 0.9 cm on previous exam, it has been upgraded to TR 5 based on more conspicuous imaging features on today's exam. **Given size (>/= 1.0 cm) and appearance, fine needle aspiration of this highly suspicious nodule should be considered based on TI-RADS criteria.   IMPRESSION: 1. Heterogeneous appearance of the thyroid gland, compatible with history of medical thyroid disease. 2. Nodule labeled 1 in the right thyroid lobe (1.0 cm TR 5) meets criteria for biopsy. Note that although this nodule has remained similar in size compared to 2021, it has been upgraded to TR 5 based on more conspicuous sonographic features on today's exam, and now meets criteria for biopsy.   The above is in keeping with the ACR TI-RADS recommendations - J Am Coll Radiol 2017;14:587-595.     Electronically Signed   By: Olive Bass M.D.   On: 02/12/2023 12:31 ----------------------------------------------------------------------------------------------------------- 03/04/23 R thyroid nodule:    Component 1 mo ago  CYTOLOGY - NON GYN CYTOLOGY - NON PAP CASE: [id number] PATIENT: Ronetta Kauth Non-Gynecological Cytology Report     Clinical History: Nodule labeled 1 appears as  a solid hypoechoic nodule with punctate echogenic foci (TR 5) in the inferior right thyroid lobe that measures 1.0 x 0.8 x 0.5 cm. Although remains similar in size measuring 0.9 cm on previous exam, it has been upgraded to TR5 Specimen Submitted:  A. THYROID, RIGHT INFERIOR, FINE NEEDLE ASPIRATION:   FINAL MICROSCOPIC DIAGNOSIS: - Findings consistent with papillary carcinoma (Bethesda category VI) - See comment   ----------------------------------------------------------------------------- Pathology - General / Other [[id number]] Collected: 05/21/23 1001 Specimen: Tissue-Pathology from Thyroid, Right Lobe Updated: 06/02/23 1606 Case Report -- Surgical Pathology Case: [id number] Authorizing Provider: Pricilla Loveless, Collected: 05/21/2023 1001 MD Ordering Location: [facility] Received: 05/21/2023 1111 Pathologist: Leodis Rains, MD Specimen: Thyroid, Right Lobe, Right Thyroid Lobe (stitch marks superior pole)  DIAGNOSIS -- A. Right thyroid lobe, right hemithyroidectomy:  Papillary thyroid carcinoma, oncocytic subtype, 1.1 cm. Lymphovascular invasion not identified. Perineural invasion not identified. Margins negative. See synoptic report.  Papillary microcarcinoma, classic subtype, 0.1 cm Lymphovascular invasion not identified. Perineural invasion not identified. Margins negative. See synoptic report.  Chronic lymphocytic thyroiditis. Parathyroid tissue, inferior pole. Three lymph nodes; negative for carcinoma (0/3 LN).  Comment: Immunohistochemical stain for BRAF performed with adequate controls on block A2 shows papillary thyroid carcinoma, oncocytic subtype is NEGATIVE for BRAF.   Diagnostic Comment -- Three lymph nodes; negative for carcinoma (0/3 LN).  Level Positive nodes Total nodes Largest metastasis (cm) Extranodal extension? midline VI 0 3 - - Grand Total 0 3 - -   Synoptic Report -- THYROID GLAND THYROID GLAND - All Specimens 8th  Edition - Protocol posted: 03/13/2022  SPECIMEN Procedure: Right lobectomy with isthmusectomy (hemithyroidectomy)  TUMOR Tumor Focality: Unifocal Tumor Characteristics: Tumor Site: Right lobe Tumor Size: Greatest Dimension (Centimeters): 0.7 cm Histologic Tumor Types and Subtypes: : Papillary carcinoma, oncocytic classic subtype Tumor Proliferative Activity: Mitotic Rate: Less than 3 mitoses per 24mm2 : 0 mitoses per 2 mm2 Tumor Necrosis: Not identified Angioinvasion (vascular invasion): Not identified Lymphatic Invasion: Not identified Perineural Invasion: Not identified Extrathyroidal Extension: Not identified Margin Status: All margins negative for carcinoma Distance from Invasive Carcinoma to Closest Margin: Less than 1 mm  REGIONAL LYMPH NODES Regional Lymph Node Status: : All regional lymph nodes negative for tumor Number of Lymph Nodes Examined: 3 Nodal Level(s) Examined: Level VI  pTNM CLASSIFICATION (  AJCC 8th Edition) Reporting of pT, pN, and (when applicable) pM categories is based on information available to the pathologist at the time the report is issued. As per the AJCC (Chapter 1, 8th Ed.) it is the managing physician's responsibility to establish the final pathologic stage based upon all pertinent information, including but potentially not limited to this pathology report. pT Category: pT1b pN Category: pN0a  ADDITIONAL FINDINGS Additional Findings: Thyroiditis: Chronic lymphocytic Additional Findings: Parathyroid gland(s) present Number of Parathyroid Glands: 1 Location of Parathyroid Gland(s): Right inferior Parathyroid Gland Findings: Within normal limits THYROID GLAND THYROID GLAND - All Specimens 8th Edition - Protocol posted: 03/13/2022  SPECIMEN Procedure: Right lobectomy with isthmusectomy (hemithyroidectomy)  TUMOR Tumor Focality: Unifocal Tumor Characteristics: Tumor Site: Right lobe Tumor Size: Greatest Dimension (Centimeters): 0.1  cm Histologic Tumor Types and Subtypes: : Papillary carcinoma, classic subtype Tumor Proliferative Activity: Mitotic Rate: Less than 3 mitoses per 18mm2 Tumor Necrosis: Not identified Angioinvasion (vascular invasion): Not identified Lymphatic Invasion: Not identified Extrathyroidal Extension: Not identified Margin Status: All margins negative for carcinoma  REGIONAL LYMPH NODES Regional Lymph Node Status: : All regional lymph nodes negative for tumor Number of Lymph Nodes Examined: 3 Nodal Level(s) Examined: Level VI  pTNM CLASSIFICATION (AJCC 8th Edition) Reporting of pT, pN, and (when applicable) pM categories is based on information available to the pathologist at the time the report is issued. As per the AJCC (Chapter 1, 8th Ed.) it is the managing physician's responsibility to establish the final pathologic stage based upon all pertinent information, including but potentially not limited to this pathology report. pT Category: pT1a pN Category: pN0a  ADDITIONAL FINDINGS Additional Findings: Thyroiditis: chronic lymphocytic Additional Findings: Parathyroid gland(s) present Number of Parathyroid Glands: 1 Location of Parathyroid Gland(s): Right inferior Parathyroid Gland Findings: Within normal limits  Clinical Information -- Thyroid cancer (CMS/HHS-HCC)  ASSESSMENT/PLAN: Tre Hunker is a 22 y.o. female with autoimmune acquired hypothyroidism who is clinically hypothyroid (increased fatigue and decreased energy) on low dose levothyroxine.  Her thyroid lab trend has been erratic in the past (was requiring larger doses of levothyroxine then had weight loss and significant reduction of thyroid hormone needs). She is s/p R hemithyroidectomy and was found to have 2 foci of papillery thyroid carcinoma without lymph node involvement. She also has vitamin D deficiency treated with ergocalciferol.  1. Papillary thyroid carcinoma (HCC) -Will repeat thyroid labs today due to patient  symptoms -I will review the literature to determine what surveillance she needs going forward  2. Acquired autoimmune hypothyroidism -Continue current levothyroxine;  Will adjust based on labs as above  3. Vitamin D deficiency -Continue ergocalciferol 50,000 units weekly.  Will repeat 25-OH D today.  The best way to reach [facility] for lab results is [phone]  Follow-up:  Return in about 2 months (around 08/05/2023).   >40 minutes spent today reviewing the medical chart, counseling the patient/family, and documenting today's encounter.  [name], MD  -------------------------------- 06/09/23 6:29 AM ADDENDUM: Results for orders placed or performed in visit on 06/05/23  TSH  Result Value Ref Range   TSH 1.77 mIU/L  T4, free  Result Value Ref Range   Free T4 1.0 0.8 - 1.8 ng/dL  T4  Result Value Ref Range   T4, Total 6.0 5.1 - 11.9 mcg/dL  VITAMIN D 25 Hydroxy (Vit-D Deficiency, Fractures)  Result Value Ref Range   Vit D, 25-Hydroxy 45 30 - 100 ng/mL   SInce FT and T4 are low, will increase levothyroxine to  37.31mcg daily.  Rx sent for tabs; she will take 1.5 tabs daily.  Will have nursing call her with the following message: Nursing staff- please call the patient with results. Thanks!  Hi Quierra, Your labs show we can increase your thyroid medicine slightly.  Please take one and a half tabs (37.74mcg) daily. Your vitamin D level looks good; please continue your current vitamin D dosing. Please let me know if you have questions! Dr. Larinda Buttery

## 2023-06-06 LAB — T4: T4, Total: 6 ug/dL (ref 5.1–11.9)

## 2023-06-06 LAB — T4, FREE: Free T4: 1 ng/dL (ref 0.8–1.8)

## 2023-06-06 LAB — TSH: TSH: 1.77 mIU/L

## 2023-06-06 LAB — VITAMIN D 25 HYDROXY (VIT D DEFICIENCY, FRACTURES): Vit D, 25-Hydroxy: 45 ng/mL (ref 30–100)

## 2023-06-09 ENCOUNTER — Telehealth (INDEPENDENT_AMBULATORY_CARE_PROVIDER_SITE_OTHER): Payer: Self-pay

## 2023-06-09 MED ORDER — LEVOTHYROXINE SODIUM 25 MCG PO TABS
37.5000 ug | ORAL_TABLET | Freq: Every day | ORAL | 5 refills | Status: DC
Start: 2023-06-09 — End: 2023-08-22

## 2023-06-09 NOTE — Telephone Encounter (Signed)
Called pt an let her know dr Jessup's advise on her lab results. Pt stated understanding and agreed to an increase in the medication. She had no further questions.

## 2023-06-09 NOTE — Telephone Encounter (Signed)
-----   Message from [name], MD sent at 06/09/2023  6:28 AM EDT ----- Nursing staff- please call the patient with results. Thanks!  Hi Jacqueline Green, Your labs show we can increase your thyroid medicine slightly.  Please take one and a half tabs (37.58mcg) daily. Your vitamin D level looks good; please continue your current vitamin D dosing. Please let me know if you have questions! Dr. Larinda Buttery

## 2023-06-09 NOTE — Addendum Note (Signed)
Addended byJudene Companion on: 06/09/2023 06:31 AM   Modules accepted: Orders

## 2023-08-19 ENCOUNTER — Ambulatory Visit (INDEPENDENT_AMBULATORY_CARE_PROVIDER_SITE_OTHER): Payer: BC Managed Care – PPO | Admitting: Pediatrics

## 2023-08-19 ENCOUNTER — Encounter (INDEPENDENT_AMBULATORY_CARE_PROVIDER_SITE_OTHER): Payer: Self-pay | Admitting: Pediatrics

## 2023-08-19 VITALS — BP 110/86 | HR 80 | Ht 62.32 in | Wt 143.2 lb

## 2023-08-19 DIAGNOSIS — E063 Autoimmune thyroiditis: Secondary | ICD-10-CM

## 2023-08-19 DIAGNOSIS — Z131 Encounter for screening for diabetes mellitus: Secondary | ICD-10-CM | POA: Diagnosis not present

## 2023-08-19 DIAGNOSIS — E559 Vitamin D deficiency, unspecified: Secondary | ICD-10-CM

## 2023-08-19 DIAGNOSIS — C73 Malignant neoplasm of thyroid gland: Secondary | ICD-10-CM | POA: Diagnosis not present
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
## 2023-08-19 NOTE — Patient Instructions (Signed)
It was a pleasure to see you in clinic today.   Feel free to contact our office during normal business hours at [phone] with questions or concerns. If you have an emergency after normal business hours, please call the above number to reach our answering service who will contact the on-call pediatric endocrinologist.  If you choose to communicate with us via MyChart, please do not send urgent messages as this inbox is NOT monitored on nights or weekends.  Urgent concerns should be discussed with the on-call pediatric endocrinologist.  -Take your thyroid medication at the same time every day -If you forget to take a dose, take it as soon as you remember.  If you don't remember until the next day, take 2 doses then.  NEVER take more than 2 doses at a time. -Use a pill box to help make it easier to keep track of doses   

## 2023-08-20 LAB — T4, FREE: Free T4: 1.1 ng/dL (ref 0.8–1.8)

## 2023-08-20 LAB — VITAMIN D 25 HYDROXY (VIT D DEFICIENCY, FRACTURES): Vit D, 25-Hydroxy: 29 ng/mL — ABNORMAL LOW (ref 30–100)

## 2023-08-20 LAB — HEMOGLOBIN A1C
Hgb A1c MFr Bld: 4.9 %{Hb} (ref <5.7)
Mean Plasma Glucose: 94 mg/dL
eAG (mmol/L): 5.2 mmol/L

## 2023-08-20 LAB — T4: T4, Total: 6.2 ug/dL (ref 5.1–11.9)

## 2023-08-20 LAB — TSH: TSH: 1.47 m[IU]/L

## 2023-08-22 MED ORDER — LEVOTHYROXINE SODIUM 88 MCG PO TABS
44.0000 ug | ORAL_TABLET | Freq: Every day | ORAL | 5 refills | Status: DC
Start: 2023-08-22 — End: 2024-01-20

## 2023-08-22 MED ORDER — ERGOCALCIFEROL 1.25 MG (50000 UT) PO CAPS
50000.0000 [IU] | ORAL_CAPSULE | ORAL | 0 refills | Status: AC
Start: 2023-08-22 — End: ?

## 2023-08-22 NOTE — Addendum Note (Signed)
Addended by: Judene Companion on: 08/22/2023 10:54 AM   Modules accepted: Orders

## 2023-10-20 ENCOUNTER — Encounter (INDEPENDENT_AMBULATORY_CARE_PROVIDER_SITE_OTHER): Payer: Self-pay | Admitting: Pediatrics

## 2023-11-24 DIAGNOSIS — J029 Acute pharyngitis, unspecified: Secondary | ICD-10-CM | POA: Diagnosis not present

## 2023-11-24 DIAGNOSIS — R0981 Nasal congestion: Secondary | ICD-10-CM | POA: Diagnosis not present

## 2023-11-27 ENCOUNTER — Other Ambulatory Visit (INDEPENDENT_AMBULATORY_CARE_PROVIDER_SITE_OTHER): Payer: Self-pay | Admitting: Pediatrics

## 2023-11-27 DIAGNOSIS — E559 Vitamin D deficiency, unspecified: Secondary | ICD-10-CM

## 2023-12-12 DIAGNOSIS — L209 Atopic dermatitis, unspecified: Secondary | ICD-10-CM | POA: Diagnosis not present

## 2023-12-12 DIAGNOSIS — L299 Pruritus, unspecified: Secondary | ICD-10-CM | POA: Diagnosis not present

## 2024-01-07 ENCOUNTER — Encounter (INDEPENDENT_AMBULATORY_CARE_PROVIDER_SITE_OTHER): Payer: Self-pay

## 2024-01-16 ENCOUNTER — Ambulatory Visit: Payer: 59 | Admitting: Internal Medicine

## 2024-01-16 ENCOUNTER — Encounter: Payer: Self-pay | Admitting: Internal Medicine

## 2024-01-16 VITALS — BP 122/70 | HR 73 | Ht 62.32 in | Wt 140.0 lb

## 2024-01-16 DIAGNOSIS — E063 Autoimmune thyroiditis: Secondary | ICD-10-CM

## 2024-01-16 DIAGNOSIS — C73 Malignant neoplasm of thyroid gland: Secondary | ICD-10-CM

## 2024-01-16 DIAGNOSIS — E890 Postprocedural hypothyroidism: Secondary | ICD-10-CM | POA: Diagnosis not present

## 2024-01-16 NOTE — Progress Notes (Unsigned)
Patient ID: Jacqueline Green, female   DOB: 04-May-2001, 23 y.o.   MRN: [id number]  HPI  Jacqueline Green is a 23 y.o.-year-old female, referred by her pediatric endocrinologist, Dr. Larinda Buttery, for management of papillary thyroid cancer and postsurgical hypothyroidism.  Thyroid cancer: Patient describes that she was found to have a thyroid nodule in 2021, when she started to have pain with swallowing.  At that time, she had a thyroid ultrasound which showed a small spongiform nodule for which no follow-up was recommended.  Her symptoms improved afterwards.  However, at the beginning of last year, she started to have pain with swallowing again and the pain was referring to her axillae.  She did feel a mass in the right axilla, which resolved since then.  During investigation for dysphagia, she had another thyroid ultrasound in 01/2023 which showed that the right thyroid nodule was very slightly increased at 1 cm but contained punctate echogenic foci.    A biopsy of the right nodule in 02/2023 showed papillary thyroid cancer.    She had R thyroidectomy in 04/2023.  Pathology was positive for oncocytic subtype of papillary thyroid cancer measuring 1.1 cm and classic variant of papillary thyroid cancer measuring 0.1 cm.  3 lymph nodes were tested and these were negative.  Reviewed available records: Thyroid ultrasound (08/08/2020): Parenchymal Echotexture: Markedly heterogenous  Isthmus: 0.3 cm  Right lobe: 5.0 cm x 1.5 cm x 1.6 cm  Left lobe: 4.7 cm x 1.5 cm x 1.7 cm  _________________________________________________________   Estimated total number of nodules >/= 1 cm: 0 _________________________________________________________   Nodule # 1:  Location: Right; Inferior  Maximum size: 0.9 cm; Other 2 dimensions: 0.8 cm x 0.4 cm Composition: spongiform (0) Spongiform nodule does not meet criteria for surveillance or biopsy  _________________________________________________________   No adenopathy    IMPRESSION: Heterogeneous thyroid compatible with medical thyroid disease.   No thyroid nodule meets criteria for biopsy or surveillance, as designated by the newly established ACR TI-RADS criteria.  Thyroid ultrasound (02/12/2023): Parenchymal Echotexture: Moderately heterogenous  Isthmus: 0.4 cm  Right lobe: 4.7 x 1.7 x 1.5 cm  Left lobe: 5.0 x 1.6 x 1.4 cm  _________________________________________________________   Estimated total number of nodules >/= 1 cm: 1 _________________________________________________________   Nodule labeled 1 appears as a solid hypoechoic nodule with punctate echogenic foci (TR 5) in the inferior right thyroid lobe that measures 1.0 x 0.8 x 0.5 cm. Although remains similar in size measuring 0.9 cm on previous exam, it has been upgraded to TR 5 based on more conspicuous imaging features on today's exam. **Given size (>/= 1.0 cm) and appearance, fine needle aspiration of this highly suspicious nodule should be considered based on TI-RADS criteria.   IMPRESSION: 1. Heterogeneous appearance of the thyroid gland, compatible with history of medical thyroid disease. 2. Nodule labeled 1 in the right thyroid lobe (1.0 cm TR 5) meets criteria for biopsy. Note that although this nodule has remained similar in size compared to 2021, it has been upgraded to TR 5 based on more conspicuous sonographic features on today's exam, and now meets criteria for biopsy.  FNA (03/04/2023): FINAL MICROSCOPIC DIAGNOSIS:  - Findings consistent with papillary carcinoma (Bethesda category VI)  - See comment   SPECIMEN ADEQUACY:  Satisfactory for evaluation   Right thyroid lobectomy (05/21/2023): A. Right thyroid lobe, right hemithyroidectomy:   Papillary thyroid carcinoma, oncocytic subtype, 1.1 cm.            Lymphovascular invasion not identified.  Perineural invasion not identified.             Margins negative.            See synoptic report.     Papillary microcarcinoma, classic subtype, 0.1 cm             Lymphovascular invasion not identified.            Perineural invasion not identified.             Margins negative.            See synoptic report.    Chronic lymphocytic thyroiditis. Parathyroid tissue, inferior pole. Three lymph nodes; negative for carcinoma (0/3 LN).   Comment: Immunohistochemical stain for BRAF performed with adequate controls on block A2 shows papillary thyroid carcinoma, oncocytic subtype is NEGATIVE for BRAF.   No results found for: "THYROGLB" No components found for: "TGAB"  03/24/2023:   Pt denies: - feeling nodules in neck - hoarseness - dysphagia - choking  Postsurgical hypothyroidism:  She is Levothyroxine 44 mcg, taken: - fasting - with water - separated by >30 min from b'fast  - no calcium, iron, PPIs, multivitamins  - on Ergocalciferol  I reviewed pt's thyroid tests: Lab Results  Component Value Date   TSH 1.47 08/19/2023   TSH 1.77 06/05/2023   TSH 1.40 01/14/2023   TSH 0.69 08/22/2022   TSH 0.95 05/17/2022   TSH 0.60 03/29/2021   TSH 0.54 12/28/2020   TSH 0.01 (L) 07/20/2020   TSH 0.01 (L) 04/20/2020   TSH <0.01 (L) 10/21/2019   FREET4 1.1 08/19/2023   FREET4 1.0 06/05/2023   FREET4 1.1 01/14/2023   FREET4 1.3 08/22/2022   FREET4 1.1 05/17/2022   FREET4 1.1 03/29/2021   FREET4 1.1 12/28/2020   FREET4 1.6 (H) 07/20/2020   FREET4 1.1 04/20/2020   FREET4 2.0 (H) 10/21/2019      Pt mentions: - fatigue - weight gain - hot flushes  No: - constipation - dry skin - hair loss  She has + FH of thyroid disorders in: mother, M sisters and brother, MGM. No FH of thyroid cancer.  No h/o radiation tx to head or neck.  She is not on Biotin, steroids.  ROS: Constitutional: + See HPI Eyes: no blurry vision, no xerophthalmia ENT: no sore throat, no nodules felt in neck, no dysphagia/odynophagia, no hoarseness Cardiovascular: no CP/SOB/palpitations/leg  swelling Respiratory: no cough/SOB Gastrointestinal: + N/NO V/D/C Musculoskeletal: no muscle/joint aches Skin: no rashes + itching Neurological: no tremors/numbness/tingling/dizziness Psychiatric: no depression/anxiety  Past Medical History:  Diagnosis Date   Acquired autoimmune hypothyroidism    Dx 04/2015.  TSH 124, FT4 0.2. TPO ab > 900   Depression    GERD (gastroesophageal reflux disease)    Seasonal allergies    takes zyrtec and flonase prn   Past Surgical History:  Procedure Laterality Date   CHOLECYSTECTOMY N/A 08/16/2022   Procedure: LAPAROSCOPIC CHOLECYSTECTOMY;  Surgeon: Fritzi Mandes, MD;  Location: [facility] OR;  Service: General;  Laterality: N/A;   none     THYROIDECTOMY Right 05/21/2023   Social History   Socioeconomic History   Marital status: Single    Spouse name: Not on file   Number of children: Not on file   Years of education: Not on file   Highest education level: Not on file  Occupational History   Not on file  Tobacco Use   Smoking status: Never    Passive exposure: Never   Smokeless tobacco:  Never  Vaping Use   Vaping status: Every Day   Substances: Nicotine  Substance and Sexual Activity   Alcohol use: Yes    Comment: just started 08/04/22- turned 21- drank a few days after that 3 beers or tequeluia- last time 08/13/22   Drug use: Yes    Frequency: 3.0 times per week    Types: Marijuana    Comment: Last time was 08/11/22   Sexual activity: Not on file  Other Topics Concern   Not on file  Social History Narrative   Student at [employer]   Social Drivers of Health   Financial Resource Strain: Low Risk  (05/21/2023)   Received from [facility], [facility]   Overall Financial Resource Strain (CARDIA)    Difficulty of Paying Living Expenses: Not hard at all  Food Insecurity: No Food Insecurity (05/21/2023)   Received from [facility], [facility]   Hunger Vital Sign    Worried  About Running Out of Food in the Last Year: Never true    Ran Out of Food in the Last Year: Never true  Transportation Needs: Unknown (05/21/2023)   Received from [facility], [facility]   [facility] - Transportation    In the past 12 months, has lack of transportation kept you from medical appointments or from getting medications?: No    Lack of Transportation (Non-Medical): Not on file  Physical Activity: Not on file  Stress: Not on file  Social Connections: Not on file  Intimate Partner Violence: Not on file   Current Outpatient Medications on File Prior to Visit  Medication Sig Dispense Refill   ergocalciferol (VITAMIN D2) 1.25 MG (50000 UT) capsule Take 1 capsule (50,000 Units total) by mouth once a week. 12 capsule 0   levonorgestrel (KYLEENA) 19.5 MG IUD 1 each by Intrauterine route once.     levothyroxine (SYNTHROID) 88 MCG tablet Take 0.5 tablets (44 mcg total) by mouth daily. 15 tablet 5   ondansetron (ZOFRAN) 4 MG tablet Take 1 tablet (4 mg total) by mouth every 4 (four) hours as needed for nausea or vomiting. (Patient not taking: Reported on 08/22/2022) 12 tablet 0   Probiotic Product (ALIGN) 4 MG CAPS Take 4 mg by mouth daily. (Patient not taking: Reported on 04/17/2023)     sucralfate (CARAFATE) 1 g tablet Take 1 tablet (1 g total) by mouth 4 (four) times daily -  with meals and at bedtime for 7 days. (Patient taking differently: Take 1 g by mouth daily as needed (Stomach pain).) 28 tablet 0   tobramycin-dexamethasone (TOBRADEX) ophthalmic solution 1 drop 4 (four) times daily. (Patient not taking: Reported on 04/17/2023)     varenicline (CHANTIX) 1 MG tablet Take by mouth. (Patient not taking: Reported on 04/17/2023)     No current facility-administered medications on file prior to visit.   Allergies  Allergen Reactions   Strawberry (Diagnostic) Hives   Family History  Problem Relation Age of Onset   Thyroid disease Mother        had benign  tumor causing hyperthyroidism in 1991, underwent partial thyroidectomy.  Was on synthroid for years post-op, then around 2010 she was able to stop synthroid     Hypertension Father     PE: BP 122/70   Pulse 73   Ht 5' 2.32" (1.583 m)   Wt 140 lb (63.5 kg)   SpO2 98%   BMI 25.34 kg/m  Wt Readings  from Last 10 Encounters:  01/16/24 140 lb (63.5 kg)  08/19/23 143 lb 3.2 oz (65 kg)  06/05/23 141 lb 9.6 oz (64.2 kg)  04/17/23 142 lb 12.8 oz (64.8 kg)  01/22/23 131 lb 3.2 oz (59.5 kg)  09/01/22 110 lb 12.8 oz (50.3 kg)  08/22/22 110 lb 12.8 oz (50.3 kg)  08/16/22 105 lb (47.6 kg)  05/16/22 112 lb (50.8 kg)  05/03/22 115 lb (52.2 kg)   Constitutional: normal weight, in NAD Eyes:  EOMI, no exophthalmos ENT: no neck masses palpated, cervical scar healed, no cervical lymphadenopathy Cardiovascular: RRR, No MRG Respiratory: CTA B Musculoskeletal: no deformities Skin:no rashes Neurological: + mild tremor with outstretched hands  ASSESSMENT: 1. Thyroid cancer - see HPI  2. Postsurgical Hypothyroidism - prev.  Hashimoto's thyroiditis diagnosed in 2016 when TSH was 125  PLAN:  1.  Papillary thyroid cancer both classical and oncocytic variant - I had a long discussion with the patient about her fairly recent diagnosis of thyroid cancer. We reviewed together the pathology >> she is stage I TNM due to age and the localized nature of the cancer - she had a TR 5 thyroid nodule with punctate echogenic foci on the thyroid ultrasound from 02/12/2023.  Biopsy was recommended and she had this 03/04/2023: papillary thyroid cancer (Bethesda category VI).  She had right thyroidectomy  on 05/21/2023 at [facility]  Reviewed surgical note: A 2 mm sliver of thyroid tissue was left behind the thyroid as the RLN was difficult to dissect.  She had hoarseness after the surgery which has improved.  0 out of 3 lymph nodes were positive.  The background was of chronic lymphocytic thyroiditis, correlating with her  history of longstanding hypothyroidism.  The pathology showed 2 types of papillary thyroid cancer: oncocytic subtype (former Hurtle cell carcinoma) 1.1 cm with less than 3 mitosis/2 mm and BRAF negative classic subtype 0.1 cm -The 2 foci of cancer did not have lymphovascular or extrathyroidal extension and were resected in toto. -At today's visit we discussed about the fact that the 2 thyroid cancer foci are small and with good prognosis since they were caught early and resected entirely.  Also, there was no evidence of cancer spread in the neck. -We discussed that the oncocytic subtype of thyroid cancer is a more aggressive subtype, however, for her, the focus was small, without increased mitosis number, without BRAF positivity, and without obvious extension.  In this particular case, hemithyroidectomy could be sufficient for further follow-up and management.  My suggestion would be to check another neck ultrasound (possible also neck CT) with close attention to lymph nodes and if there is any suspicion for extrathyroidal spread or any unusual masses in the left thyroid lobe, I will suggest to proceed with total thyroidectomy and then RAI treatment.  The oncocytic PTC is not as responsive to RAI treatment as the classical PTC but RAI treatment is still performed not only for micrometastasis ablation but also to facilitate follow-up by checking thyroglobulin.  She describes pain in both axillae at the time of her diagnosis, so I am tempted to recommend chest imaging to look at lymph nodes but will wait for the results of thyroid ultrasound first. - if we need to go to RAI treatment, this is done nowadays with Thyrogen stimulation and not hormone withdrawal. -Thyroglobulin measurement is not currently indicated for follow-up of patient with thyroid cancer which only underwent hemithyroidectomy due to the inherent variability of the Tg level however, for her, we could still  follow the thyroglobulin and only  respond to clear trends.  Patient understands and agrees. -For now, I plan to see her back in 6 months  2.  Patient with h/o total thyroidectomy for cancer, now with iatrogenic hypothyroidism, on levothyroxine therapy.  - latest thyroid labs reviewed with pt. >> normal: Lab Results  Component Value Date   TSH 1.47 08/19/2023  - she continues on LT4 44 mcg daily - pt feels good on this dose. - we discussed about taking the thyroid hormone every day, with water, >30 minutes before breakfast, separated by >4 hours from acid reflux medications, calcium, iron, multivitamins. Pt. is taking it correctly. - will check thyroid tests today: TSH and fT4.  We discussed that for thyroid cancer we aim for a TSH at the lower limit of the target range and even slightly lower if possible. - If labs are abnormal, she will need to return for repeat TFTs in 1.5 months  Orders Placed This Encounter  Procedures   US THYROID   TSH   T4, free   Thyroglobulin Level   Thyroglobulin antibody   - time spent for the visit: 1 hour, in precharting, post charting, reviewing her previous labs, imaging evaluations, office visit notes in both Epic and Care Everywhere, and previous treatments, counseling her about her endocrine conditions (please see the discussed topics above), and developing a plan to investigate and treat them. She had a number of questions which I addressed.    Carlus Pavlov, MD PhD [facility] Endocrinology

## 2024-01-16 NOTE — Patient Instructions (Signed)
Please continue Levothyroxine 44 mcg daily.  Take the thyroid hormone every day, with water, at least 30 minutes before breakfast, separated by at least 4 hours from: - acid reflux medications - calcium - iron - multivitamins  Please stop at the lab.  Let's schedule a new thyroid ultrasound.  Please return in 6 months but possibly sooner for labs.

## 2024-01-19 LAB — T4, FREE: Free T4: 1.3 ng/dL (ref 0.8–1.8)

## 2024-01-19 LAB — TSH: TSH: 1.68 m[IU]/L

## 2024-01-19 LAB — THYROGLOBULIN LEVEL: Thyroglobulin: 19.6 ng/mL

## 2024-01-19 LAB — THYROGLOBULIN ANTIBODY: Thyroglobulin Ab: 1 [IU]/mL (ref <=1)

## 2024-01-20 ENCOUNTER — Other Ambulatory Visit: Payer: Self-pay | Admitting: Internal Medicine

## 2024-01-20 ENCOUNTER — Encounter: Payer: Self-pay | Admitting: Internal Medicine

## 2024-01-20 ENCOUNTER — Ambulatory Visit
Admission: RE | Admit: 2024-01-20 | Discharge: 2024-01-20 | Disposition: A | Discharge status: Home / Self Care | Payer: 59 | Source: Ambulatory Visit | Attending: Internal Medicine | Admitting: Internal Medicine

## 2024-01-20 DIAGNOSIS — C73 Malignant neoplasm of thyroid gland: Secondary | ICD-10-CM

## 2024-01-20 DIAGNOSIS — E041 Nontoxic single thyroid nodule: Secondary | ICD-10-CM | POA: Diagnosis not present

## 2024-01-20 MED ORDER — LEVOTHYROXINE SODIUM 50 MCG PO TABS: 50.0000 ug | ORAL_TABLET | Freq: Every day | ORAL | 3 refills | Status: DC

## 2024-01-26 ENCOUNTER — Telehealth: Payer: Self-pay

## 2024-01-26 NOTE — Telephone Encounter (Signed)
Pt called the office and has been notified of her results.   Jacqueline Green

## 2024-01-26 NOTE — Telephone Encounter (Signed)
Received a call from [facility]  They do not perform CT SOFT TISSUE NECK W WO CONTRAST (Order [id number]) it has to be changed to ParaThyroid 4D Neck with and Without  Since the pt has/ had thyroid cancer.   Caller: Pansy w/ [facility]

## 2024-01-27 ENCOUNTER — Encounter: Payer: Self-pay | Admitting: Internal Medicine

## 2024-01-27 DIAGNOSIS — Z113 Encounter for screening for infections with a predominantly sexual mode of transmission: Secondary | ICD-10-CM | POA: Diagnosis not present

## 2024-01-27 DIAGNOSIS — Z124 Encounter for screening for malignant neoplasm of cervix: Secondary | ICD-10-CM | POA: Diagnosis not present

## 2024-01-27 DIAGNOSIS — Z30432 Encounter for removal of intrauterine contraceptive device: Secondary | ICD-10-CM | POA: Diagnosis not present

## 2024-01-27 DIAGNOSIS — Z01411 Encounter for gynecological examination (general) (routine) with abnormal findings: Secondary | ICD-10-CM | POA: Diagnosis not present

## 2024-01-27 NOTE — Telephone Encounter (Signed)
 Noted

## 2024-01-29 NOTE — Telephone Encounter (Signed)
 [facility] called back and said that the order needed to be changed to with contrast. I saw the note below so I wasn't sure if patient was still planing on doing it or not. [facility] just asked for a call back at [phone] option 1 and then option 4 to let them know an update

## 2024-01-30 ENCOUNTER — Other Ambulatory Visit: Payer: Self-pay | Admitting: Internal Medicine

## 2024-01-30 ENCOUNTER — Encounter: Payer: Self-pay | Admitting: Internal Medicine

## 2024-02-03 ENCOUNTER — Other Ambulatory Visit: Payer: Self-pay | Admitting: Internal Medicine

## 2024-02-25 NOTE — Telephone Encounter (Signed)
 [facility] called back and said the CT order needs to be changed to with contrast only. Patient has scan tomorrow.

## 2024-02-25 NOTE — Telephone Encounter (Signed)
 Pt has decided to get the scan. Please order new scan

## 2024-02-26 ENCOUNTER — Other Ambulatory Visit: Payer: Self-pay | Admitting: Internal Medicine

## 2024-02-26 ENCOUNTER — Ambulatory Visit
Admission: RE | Admit: 2024-02-26 | Discharge: 2024-02-26 | Disposition: A | Discharge status: Home / Self Care | Source: Ambulatory Visit | Attending: Internal Medicine | Admitting: Internal Medicine

## 2024-02-26 ENCOUNTER — Other Ambulatory Visit: Payer: 59

## 2024-02-26 DIAGNOSIS — C73 Malignant neoplasm of thyroid gland: Secondary | ICD-10-CM

## 2024-02-26 MED ORDER — IOPAMIDOL (ISOVUE-300) INJECTION 61%
75.0000 mL | Freq: Once | INTRAVENOUS | Status: AC | PRN
  Administered 2024-02-26: 75 mL via INTRAVENOUS

## 2024-02-26 NOTE — Telephone Encounter (Signed)
 I asked Jacqueline Green to let them know to order the appropriate test, if they think that the order needs to be changed and routed to me.  This is what they were doing in the past and please let's resume this!

## 2024-03-02 ENCOUNTER — Other Ambulatory Visit

## 2024-03-02 DIAGNOSIS — E890 Postprocedural hypothyroidism: Secondary | ICD-10-CM | POA: Diagnosis not present

## 2024-03-02 LAB — T4, FREE: Free T4: 1.2 ng/dL (ref 0.8–1.8)

## 2024-03-02 LAB — TSH: TSH: 1.25 m[IU]/L

## 2024-03-03 ENCOUNTER — Other Ambulatory Visit

## 2024-03-03 ENCOUNTER — Other Ambulatory Visit: Payer: 59

## 2024-03-16 ENCOUNTER — Encounter: Payer: Self-pay | Admitting: Internal Medicine

## 2024-03-22 DIAGNOSIS — H5213 Myopia, bilateral: Secondary | ICD-10-CM | POA: Diagnosis not present

## 2024-04-21 DIAGNOSIS — F4312 Post-traumatic stress disorder, chronic: Secondary | ICD-10-CM | POA: Diagnosis not present

## 2024-05-03 DIAGNOSIS — F4312 Post-traumatic stress disorder, chronic: Secondary | ICD-10-CM | POA: Diagnosis not present

## 2024-05-12 DIAGNOSIS — F4312 Post-traumatic stress disorder, chronic: Secondary | ICD-10-CM | POA: Diagnosis not present

## 2024-05-19 ENCOUNTER — Encounter: Payer: Self-pay | Admitting: Internal Medicine

## 2024-06-09 DIAGNOSIS — F4312 Post-traumatic stress disorder, chronic: Secondary | ICD-10-CM | POA: Diagnosis not present

## 2024-06-11 ENCOUNTER — Other Ambulatory Visit: Payer: Self-pay | Admitting: Internal Medicine

## 2024-06-11 DIAGNOSIS — E063 Autoimmune thyroiditis: Secondary | ICD-10-CM

## 2024-06-14 ENCOUNTER — Other Ambulatory Visit

## 2024-06-14 DIAGNOSIS — E063 Autoimmune thyroiditis: Secondary | ICD-10-CM | POA: Diagnosis not present

## 2024-06-14 LAB — T4, FREE: Free T4: 1.5 ng/dL (ref 0.8–1.8)

## 2024-06-14 LAB — TSH: TSH: 0.9 m[IU]/L

## 2024-06-15 ENCOUNTER — Ambulatory Visit: Payer: Self-pay | Admitting: Internal Medicine

## 2024-07-21 ENCOUNTER — Ambulatory Visit: Payer: 59 | Admitting: Internal Medicine

## 2024-07-21 ENCOUNTER — Encounter: Payer: Self-pay | Admitting: Internal Medicine

## 2024-07-21 VITALS — BP 120/68 | HR 91 | Ht 62.32 in | Wt 126.6 lb

## 2024-07-21 DIAGNOSIS — E890 Postprocedural hypothyroidism: Secondary | ICD-10-CM

## 2024-07-21 DIAGNOSIS — C73 Malignant neoplasm of thyroid gland: Secondary | ICD-10-CM | POA: Diagnosis not present

## 2024-07-21 DIAGNOSIS — F4312 Post-traumatic stress disorder, chronic: Secondary | ICD-10-CM | POA: Diagnosis not present

## 2024-07-21 NOTE — Progress Notes (Addendum)
 Patient ID: Jacqueline Green, female   DOB: 2001-12-19, 23 y.o.   MRN: [id number]  HPI  Jacqueline Green is a 23 y.o.-year-old female, initially referred by her pediatric endocrinologist, Dr. Willo, for management of papillary thyroid  cancer and postsurgical hypothyroidism.  Last visit 6 months ago.  Interim history: Patient feels well, but does have some fatigue and occasional depression.  No hair loss.   Reviewed history: Patient describes that she was found to have a thyroid  nodule in 2021, when she started to have pain with swallowing.  At that time, she had a thyroid  ultrasound which showed a small spongiform nodule for which no follow-up was recommended.  Her symptoms improved afterwards.  However, at the beginning of last year, she started to have pain with swallowing again and the pain was referring to her axillae.  She did feel a mass in the right axilla, which resolved since then.  During investigation for dysphagia, she had another thyroid  ultrasound in 01/2023 which showed that the right thyroid  nodule was very slightly increased at 1 cm but contained punctate echogenic foci.    A biopsy of the right nodule in 02/2023 showed papillary thyroid  cancer.    She had R thyroidectomy in 04/2023.  Pathology was positive for oncocytic subtype of papillary thyroid  cancer measuring 1.1 cm and classic variant of papillary thyroid  cancer measuring 0.1 cm.  3 lymph nodes were tested and these were negative.  Reviewed available records: Thyroid  ultrasound (08/08/2020): Parenchymal Echotexture: Markedly heterogenous  Isthmus: 0.3 cm  Right lobe: 5.0 cm x 1.5 cm x 1.6 cm  Left lobe: 4.7 cm x 1.5 cm x 1.7 cm  _________________________________________________________   Estimated total number of nodules >/= 1 cm: 0 _________________________________________________________   Nodule # 1:  Location: Right; Inferior  Maximum size: 0.9 cm; Other 2 dimensions: 0.8 cm x 0.4 cm Composition: spongiform  (0) Spongiform nodule does not meet criteria for surveillance or biopsy  _________________________________________________________   No adenopathy   IMPRESSION: Heterogeneous thyroid  compatible with medical thyroid  disease.   No thyroid  nodule meets criteria for biopsy or surveillance, as designated by the newly established ACR TI-RADS criteria.  Thyroid  ultrasound (02/12/2023): Parenchymal Echotexture: Moderately heterogenous  Isthmus: 0.4 cm  Right lobe: 4.7 x 1.7 x 1.5 cm  Left lobe: 5.0 x 1.6 x 1.4 cm  _________________________________________________________   Estimated total number of nodules >/= 1 cm: 1 _________________________________________________________   Nodule labeled 1 appears as a solid hypoechoic nodule with punctate echogenic foci (TR 5) in the inferior right thyroid  lobe that measures 1.0 x 0.8 x 0.5 cm. Although remains similar in size measuring 0.9 cm on previous exam, it has been upgraded to TR 5 based on more conspicuous imaging features on today's exam. **Given size (>/= 1.0 cm) and appearance, fine needle aspiration of this highly suspicious nodule should be considered based on TI-RADS criteria.   IMPRESSION: 1. Heterogeneous appearance of the thyroid  gland, compatible with history of medical thyroid  disease. 2. Nodule labeled 1 in the right thyroid  lobe (1.0 cm TR 5) meets criteria for biopsy. Note that although this nodule has remained similar in size compared to 2021, it has been upgraded to TR 5 based on more conspicuous sonographic features on today's exam, and now meets criteria for biopsy.  FNA (03/04/2023): FINAL MICROSCOPIC DIAGNOSIS:  - Findings consistent with papillary carcinoma (Bethesda category VI)  - See comment   SPECIMEN ADEQUACY:  Satisfactory for evaluation   Right thyroid  lobectomy (05/21/2023): A. Right thyroid  lobe, right hemithyroidectomy:  Papillary thyroid  carcinoma, oncocytic subtype, 1.1 cm.             Lymphovascular invasion not identified.            Perineural invasion not identified.             Margins negative.            See synoptic report.    Papillary microcarcinoma, classic subtype, 0.1 cm             Lymphovascular invasion not identified.            Perineural invasion not identified.             Margins negative.            See synoptic report.    Chronic lymphocytic thyroiditis. Parathyroid tissue, inferior pole. Three lymph nodes; negative for carcinoma (0/3 LN).   Comment: Immunohistochemical stain for BRAF performed with adequate controls on block A2 shows papillary thyroid  carcinoma, oncocytic subtype is NEGATIVE for BRAF.  Neck U/S (01/20/2024): Parenchymal Echotexture: Moderately heterogenous  Isthmus: 3 mm  Right lobe: Surgically absent  Left lobe: 5.1 x 1.6 x 1.5 cm  _________________________________________________________   Estimated total number of nodules >/= 1 cm: 0 _________________________________________________________   Patient is now status post right thyroidectomy. No thyroid  bed abnormality, nodule or residual tissue. Stable heterogeneous left thyroid  gland without nodule or focal abnormality. No regional adenopathy.   IMPRESSION: 1. Status post right thyroidectomy. 2. Stable heterogeneous left thyroid  gland without nodule or focal abnormality.  Neck ultrasound did not show any suspicious masses. However, the patient nature of the oncocytic thyroid  cancer, I suggested to try to get a neck CT with close attention to any possible abnormal lymph nodes.  Neck CT (02/26/2024): Pharynx and larynx: No evidence of mass or swelling  Salivary glands: Unremarkable  Thyroid : A normal size left lobe thyroid  persists. No residual high-density tissue seen in the right thyroid  bed  Lymph nodes: None enlarged or heterogeneous.  Vascular: Unremarkable  Limited intracranial: Negative  Visualized orbits: Partial coverage is negative  Mastoids and  visualized paranasal sinuses: Polypoid densities in the bilateral maxillary sinus which could be polyps or retention cysts.  Skeleton: Unremarkable  Upper chest: Unremarkable   IMPRESSION: Right hemithyroidectomy with normal size remaining left lobe. No adenopathy in the neck.  Lab Results  Component Value Date   THYROGLB 19.6 01/16/2024   Lab Results  Component Value Date   THGAB 1 01/16/2024   THGAB 7 (H) 09/21/2015   03/24/2023:   Pt denies: - feeling nodules in neck - hoarseness - dysphagia - choking  Postsurgical hypothyroidism:  She is Levothyroxine  50 , increased from 44 mcg daily in 12/2023, taken: - fasting - with water - separated by >30 min from b'fast  - no calcium, iron, PPIs - + multivitamins at night - on Ergocalciferol   I reviewed pt's thyroid  tests: Lab Results  Component Value Date   TSH 0.90 06/14/2024   TSH 1.25 03/02/2024   TSH 1.68 01/16/2024   TSH 1.47 08/19/2023   TSH 1.77 06/05/2023   TSH 1.40 01/14/2023   TSH 0.69 08/22/2022   TSH 0.95 05/17/2022   TSH 0.60 03/29/2021   TSH 0.54 12/28/2020   FREET4 1.5 06/14/2024   FREET4 1.2 03/02/2024   FREET4 1.3 01/16/2024   FREET4 1.1 08/19/2023   FREET4 1.0 06/05/2023   FREET4 1.1 01/14/2023   FREET4 1.3 08/22/2022   FREET4 1.1 05/17/2022   FREET4 1.1  03/29/2021   FREET4 1.1 12/28/2020      Pt denies: - constipation - dry skin - hair loss  She has + FH of thyroid  disorders in: mother, M sisters and brother, MGM. No FH of thyroid  cancer.  No h/o radiation tx to head or neck.  She is not on Biotin, steroids.  ROS: Constitutional: + See HPI  Past Medical History:  Diagnosis Date   Acquired autoimmune hypothyroidism    Dx 04/2015.  TSH 124, FT4 0.2. TPO ab > 900   Depression    GERD (gastroesophageal reflux disease)    Seasonal allergies    takes zyrtec and flonase prn   Past Surgical History:  Procedure Laterality Date   CHOLECYSTECTOMY N/A 08/16/2022   Procedure:  LAPAROSCOPIC CHOLECYSTECTOMY;  Surgeon: Dasie Leonor CROME, MD;  Location: [facility] OR;  Service: General;  Laterality: N/A;   none     THYROIDECTOMY Right 05/21/2023   Social History   Socioeconomic History   Marital status: Single    Spouse name: Not on file   Number of children: Not on file   Years of education: Not on file   Highest education level: Not on file  Occupational History   Not on file  Tobacco Use   Smoking status: Never    Passive exposure: Never   Smokeless tobacco: Never  Vaping Use   Vaping status: Every Day   Substances: Nicotine  Substance and Sexual Activity   Alcohol use: Yes    Comment: just started 08/04/22- turned 21- drank a few days after that 3 beers or tequeluia- last time 08/13/22   Drug use: Yes    Frequency: 3.0 times per week    Types: Marijuana    Comment: Last time was 08/11/22   Sexual activity: Not on file  Other Topics Concern   Not on file  Social History Narrative   Student at [employer]   Social Drivers of Health   Financial Resource Strain: Low Risk  (05/21/2023)   Received from [facility]   Overall Financial Resource Strain (CARDIA)    Difficulty of Paying Living Expenses: Not hard at all  Food Insecurity: No Food Insecurity (05/21/2023)   Received from [facility]   Hunger Vital Sign    Within the past 12 months, you worried that your food would run out before you got the money to buy more.: Never true    Within the past 12 months, the food you bought just didn't last and you didn't have money to get more.: Never true  Transportation Needs: Unknown (05/21/2023)   Received from [facility] - Transportation    In the past 12 months, has lack of transportation kept you from medical appointments or from getting medications?: No    Lack of Transportation (Non-Medical): Not on file  Physical Activity: Not on file  Stress: Not on file  Social Connections: Not on file  Intimate  Partner Violence: Not on file   Current Outpatient Medications on File Prior to Visit  Medication Sig Dispense Refill   ergocalciferol  (VITAMIN D2) 1.25 MG (50000 UT) capsule Take 1 capsule (50,000 Units total) by mouth once a week. 12 capsule 0   levonorgestrel  (KYLEENA ) 19.5 MG IUD 1 each by Intrauterine route once.     levothyroxine  (SYNTHROID ) 50 MCG tablet Take 1 tablet (50 mcg total) by mouth daily. 45 tablet 3   Probiotic Product (ALIGN) 4 MG CAPS Take 4 mg by mouth  daily.     No current facility-administered medications on file prior to visit.   Allergies  Allergen Reactions   Strawberry (Diagnostic) Hives   Family History  Problem Relation Age of Onset   Thyroid  disease Mother        had benign tumor causing hyperthyroidism in 1991, underwent partial thyroidectomy.  Was on synthroid  for years post-op, then around 2010 she was able to stop synthroid      Hypertension Father     PE: BP 120/68   Pulse 91   Ht 5' 2.32 (1.583 m)   Wt 126 lb 9.6 oz (57.4 kg)   SpO2 99%   BMI 22.92 kg/m  Wt Readings from Last 10 Encounters:  07/21/24 126 lb 9.6 oz (57.4 kg)  01/16/24 140 lb (63.5 kg)  08/19/23 143 lb 3.2 oz (65 kg)  06/05/23 141 lb 9.6 oz (64.2 kg)  04/17/23 142 lb 12.8 oz (64.8 kg)  01/22/23 131 lb 3.2 oz (59.5 kg)  09/01/22 110 lb 12.8 oz (50.3 kg)  08/22/22 110 lb 12.8 oz (50.3 kg)  08/16/22 105 lb (47.6 kg)  05/16/22 112 lb (50.8 kg)   Constitutional: normal weight, in NAD Eyes:  EOMI, no exophthalmos ENT: no neck masses palpated, cervical scar healed, no cervical lymphadenopathy Cardiovascular: RRR, No MRG Respiratory: CTA B Musculoskeletal: no deformities Skin:no rashes Neurological: no tremor with outstretched hands  ASSESSMENT: 1. Thyroid  cancer - see HPI  2. Postsurgical Hypothyroidism - prev.  Hashimoto's thyroiditis diagnosed in 2016 when TSH was 125  PLAN:  1.  Papillary thyroid  cancer both classical and oncocytic variant -Patient is stage I  TNM due to age and the localized nature of the cancer - she had a TR 5 thyroid  nodule with punctate echogenic foci on the thyroid  ultrasound from 02/12/2023.  Biopsy was recommended and she had this 03/04/2023: papillary thyroid  cancer (Bethesda category VI).  She had right thyroidectomy  on 05/21/2023 at [facility].  Reviewed surgical note: A 2 mm sliver of thyroid  tissue was left behind the thyroid  as the RLN was difficult to dissect.  She had hoarseness after the surgery which has improved.  0 out of 3 lymph nodes were positive.  The background was of chronic lymphocytic thyroiditis, correlating with her history of longstanding hypothyroidism.  The pathology showed 2 types of papillary thyroid  cancer: oncocytic subtype (former Hurtle cell carcinoma) 1.1 cm with less than 3 mitosis/2 mm and BRAF negative classic subtype 0.1 cm -The 2 foci of cancer did not have lymphovascular or extrathyroidal extension and were resected in toto -We discussed at our last visit that the 2 thyroid  cancer foci were small and without worrisome feature so she has good prognosis of her disease due to catching the cancer early and resecting it entirely.  Also, there was no evidence of cancer spread in the neck. -We discussed that the oncocytic subtype of thyroid  cancer is a more aggressive subtype, however, for her, the focus was small, without increased mitosis number, without BRAF positivity, and without obvious extension.  In this particular case, hemithyroidectomy could be sufficient for further follow-up and management.  We checked a neck ultrasound that did not show any worrisome findings, however, we also checked a neck CT with close attention to the lymph nodes and there was no suspicion for extrathyroidal spread in the neck or any unusual masses in the left thyroid  lobe to prompt completion thyroidectomy and then RAI treatment.  We discussed that the oncocytic PTC is not as responsive to RAI  treatment as the classical PTC but RAI  treatment is still performed not only for micrometastasis ablation but also to facilitate follow-up by checking thyroglobulin.  She previously described pain in both axillae at the time of her diagnosis, which resolved. -At today's visit we reviewed the results of the imaging tests obtained since last visit and the plan to repeat an ultrasound in approximately a year from the previous.  -We also discussed that thyroglobulin measurement is not currently indicated for follow-up of patient with thyroid  cancer which only underwent hemithyroidectomy due to the inherent variability of the thyroglobulin level, however, for her, we discussed about still following thyroglobulin and only responding to clear trends.  Patient understands and agrees with the plan.  We will recheck this today -For now, I plan to see her back in 1 year, but I advised her to get in touch with me in 6 months I can order any ultrasound  2.  Patient with h/o total thyroidectomy for cancer, now with iatrogenic hypothyroidism, on levothyroxine  therapy - latest thyroid  labs reviewed with pt. >> normal: Lab Results  Component Value Date   TSH 0.90 06/14/2024  - she continues on LT4 50 mcg daily, increased from 44 mcg daily at last visit - pt feels good on this dose.  However, she lost 14 pounds since last visit. - we discussed about taking the thyroid  hormone every day, with water, >30 minutes before breakfast, separated by >4 hours from acid reflux medications, calcium, iron, multivitamins. Pt. is taking it correctly. - will check thyroid  tests today: TSH and fT4 - If labs are abnormal, she will need to return for repeat TFTs in 1.5 months  Component     Latest Ref Rng 07/21/2024  T4,Free(Direct)     0.8 - 1.8 ng/dL 1.2   TSH     mIU/L 8.35   Comment -   Thyroglobulin     ng/mL 17.0   Thyroglobulin Ab     < or = 1 IU/mL 1   TSH is normal and thyroglobulin is not higher.  Lela Fendt, MD PhD [facility] Endocrinology

## 2024-07-21 NOTE — Patient Instructions (Addendum)
 Please continue Levothyroxine  50 mcg daily.  Take the thyroid  hormone every day, with water, at least 30 minutes before breakfast, separated by at least 4 hours from: - acid reflux medications - calcium - iron - multivitamins  Please stop at the lab.  Let's schedule a new thyroid  ultrasound in January - please send me a message then so I can order it.  Please return in 1 year but possibly sooner for labs.

## 2024-07-26 ENCOUNTER — Ambulatory Visit: Payer: Self-pay | Admitting: Internal Medicine

## 2024-07-26 DIAGNOSIS — F4312 Post-traumatic stress disorder, chronic: Secondary | ICD-10-CM | POA: Diagnosis not present

## 2024-07-26 LAB — T4, FREE: Free T4: 1.2 ng/dL (ref 0.8–1.8)

## 2024-07-26 LAB — THYROGLOBULIN ANTIBODY: Thyroglobulin Ab: 1 [IU]/mL (ref <=1)

## 2024-07-26 LAB — TSH: TSH: 1.64 m[IU]/L

## 2024-07-26 LAB — THYROGLOBULIN LEVEL: Thyroglobulin: 17 ng/mL

## 2024-07-27 DIAGNOSIS — R110 Nausea: Secondary | ICD-10-CM | POA: Diagnosis not present

## 2024-07-27 DIAGNOSIS — S060X0A Concussion without loss of consciousness, initial encounter: Secondary | ICD-10-CM | POA: Diagnosis not present

## 2024-07-27 MED ORDER — LEVOTHYROXINE SODIUM 50 MCG PO TABS: 50.0000 ug | ORAL_TABLET | Freq: Every day | ORAL | 3 refills | Status: DC

## 2024-07-27 NOTE — Addendum Note (Signed)
 Addended by: TRIXIE FILE on: 07/27/2024 11:58 AM   Modules accepted: Orders

## 2024-08-02 DIAGNOSIS — F4312 Post-traumatic stress disorder, chronic: Secondary | ICD-10-CM | POA: Diagnosis not present

## 2024-08-07 ENCOUNTER — Other Ambulatory Visit: Payer: Self-pay | Admitting: Internal Medicine

## 2024-08-16 DIAGNOSIS — F4312 Post-traumatic stress disorder, chronic: Secondary | ICD-10-CM | POA: Diagnosis not present

## 2024-09-24 ENCOUNTER — Other Ambulatory Visit: Payer: Self-pay | Admitting: Internal Medicine

## 2024-09-24 ENCOUNTER — Telehealth: Payer: Self-pay | Admitting: Internal Medicine

## 2024-09-24 DIAGNOSIS — R590 Localized enlarged lymph nodes: Secondary | ICD-10-CM

## 2024-09-24 DIAGNOSIS — C73 Malignant neoplasm of thyroid gland: Secondary | ICD-10-CM

## 2024-09-24 NOTE — Telephone Encounter (Signed)
 J, I put a referral in for ENT.  She just had an ultrasound and a CT earlier this year, for now, I do not feel we absolutely need an ultrasound, but lets see what ENT says; she may also need to see her PCP

## 2024-09-24 NOTE — Telephone Encounter (Signed)
 Patient called this morning stating she is having swelling of lymph nodes and neck region, slight pain/tightness sometimes.She called an ENT office and she needs a referral. Also she mentioned wanting to do ultrasound earlier than planned.Please advise

## 2024-09-30 DIAGNOSIS — F4312 Post-traumatic stress disorder, chronic: Secondary | ICD-10-CM | POA: Diagnosis not present

## 2024-10-04 ENCOUNTER — Encounter (INDEPENDENT_AMBULATORY_CARE_PROVIDER_SITE_OTHER): Payer: Self-pay | Admitting: Otolaryngology

## 2024-10-04 ENCOUNTER — Ambulatory Visit (INDEPENDENT_AMBULATORY_CARE_PROVIDER_SITE_OTHER): Admitting: Otolaryngology

## 2024-10-04 VITALS — BP 116/74 | HR 90 | Temp 98.9°F | Ht 61.0 in | Wt 125.0 lb

## 2024-10-04 DIAGNOSIS — J383 Other diseases of vocal cords: Secondary | ICD-10-CM

## 2024-10-04 DIAGNOSIS — R490 Dysphonia: Secondary | ICD-10-CM | POA: Diagnosis not present

## 2024-10-04 DIAGNOSIS — H6980 Other specified disorders of Eustachian tube, unspecified ear: Secondary | ICD-10-CM | POA: Diagnosis not present

## 2024-10-04 DIAGNOSIS — K219 Gastro-esophageal reflux disease without esophagitis: Secondary | ICD-10-CM

## 2024-10-04 DIAGNOSIS — R0981 Nasal congestion: Secondary | ICD-10-CM

## 2024-10-04 DIAGNOSIS — J312 Chronic pharyngitis: Secondary | ICD-10-CM | POA: Diagnosis not present

## 2024-10-04 DIAGNOSIS — J309 Allergic rhinitis, unspecified: Secondary | ICD-10-CM | POA: Diagnosis not present

## 2024-10-04 DIAGNOSIS — R09A0 Foreign body sensation, unspecified: Secondary | ICD-10-CM | POA: Diagnosis not present

## 2024-10-04 DIAGNOSIS — R0982 Postnasal drip: Secondary | ICD-10-CM

## 2024-10-04 MED ORDER — FAMOTIDINE 20 MG PO TABS: 20.0000 mg | ORAL_TABLET | Freq: Two times a day (BID) | ORAL | 1 refills | Status: DC

## 2024-10-04 MED ORDER — FLUTICASONE PROPIONATE 50 MCG/ACT NA SUSP: 2.0000 | Freq: Two times a day (BID) | NASAL | 6 refills | Status: AC

## 2024-10-04 MED ORDER — LEVOCETIRIZINE DIHYDROCHLORIDE 5 MG PO TABS: 5.0000 mg | ORAL_TABLET | Freq: Every evening | ORAL | 3 refills | Status: AC

## 2024-10-04 NOTE — Patient Instructions (Addendum)
 [employer] - check out this website to learn more about reflux   -Avoid lying down for at least two hours after a meal or after drinking acidic beverages, like soda, or other caffeinated beverages. This can help to prevent stomach contents from flowing back into the esophagus. -Keep your head elevated while you sleep. Using an extra pillow or two can also help to prevent reflux. -Eat smaller and more frequent meals each day instead of a few large meals. This promotes digestion and can aid in preventing heartburn. -Wear loose-fitting clothes to ease pressure on the stomach, which can worsen heartburn and reflux. -Reduce excess weight around the midsection. This can ease pressure on the stomach. Such pressure can force some stomach contents back up the esophagus - Take Reflux Gourmet (natural supplement available on Amazon) to help with symptoms of chronic throat irritation     See information about Eustachian Tube Dysfunction below:    Overview The eustachian (say "you-STAY-shee-un") tubes connect the middle ear on each side to the back of the throat. They keep air pressure stable in the ears. If your eustachian tubes become blocked, the air pressure in your ears changes. A quick change in air pressure can cause eustachian tubes to close up. This might happen when an airplane changes altitude or when a scuba diver goes up or down underwater. And a cold can make the tubes swell and block the fluid in the middle ear from draining out. That can cause pain.  Eustachian tube problems often clear up on their own or after treating the cause of the blockage. If your tubes continue to be blocked, you may need surgery.  Follow-up care is a key part of your treatment and safety. Be sure to make and go to all appointments, and call your doctor or nurse advice line (811 in most provinces and territories) if you are having problems. It's also a good idea to know your test results and keep a list of  the medicines you take.  How can you care for yourself at home? Try a simple exercise to help open blocked tubes. Close your mouth, hold your nose, and gently blow as if you are blowing your nose. Yawning and chewing gum also may help. You may hear or feel a "pop" when the tubes open. To ease ear pain, apply a warm face cloth or a heating pad set on low. There may be some drainage from the ear when the heat melts earwax. Put a cloth between the heat source and your skin. If your doctor prescribed antibiotics, take them as directed. Do not stop taking them just because you feel better. You need to take the full course of antibiotics. Be safe with medicines. Depending on the cause of the problem, your doctor may recommend over-the-counter medicine. For example, adults may try decongestants for cold symptoms or nasal spray steroids for allergies. Follow the instructions carefully.

## 2024-10-04 NOTE — Progress Notes (Signed)
 ENT CONSULT:  Reason for Consult: hx of thyroid  lobectomy and VF paralysis, recurrent dysphonia and sensation of lymph nodes    HPI: Discussed the use of AI scribe software for clinical note transcription with the patient, who gave verbal consent to proceed.  History of Present Illness Jacqueline Green is a 23 year old female with thyroid  cancer/papillary who presents with throat discomfort and voice changes and sensation of swollen node along right mandible. She was referred by her endocrinologist for evaluation of throat symptoms.  She has a history of thyroid  cancer and underwent a partial thyroidectomy in 2024. She did not receive radioactive iodine treatment. Recent imaging, including an ultrasound and CT of the neck, showed a residual thyroid  gland.  She experiences a sensation of something scratching in the back of her throat and feels that her lymph node is 'super swollen'.   During the thyroid  surgery,her thyroid  tumor was close to a nerve, and it initially affected her voice, but it has mostly returned. She experiences a scratchy throat and fullness in her ears, which she associates with her throat symptoms. She has been experiencing these symptoms for an unspecified duration.  She takes over-the-counter antacids frequently due to heartburn and reflux symptoms, which she experiences with most foods except yogurt and bananas. She also takes a probiotic and prebiotic for ongoing stomach issues.  No drainage from her ears or ear pain.   Records Reviewed:  Endocrine Office visit  Patient describes that she was found to have a thyroid  nodule in 2021, when she started to have pain with swallowing.  At that time, she had a thyroid  ultrasound which showed a small spongiform nodule for which no follow-up was recommended.  Her symptoms improved afterwards.   However, at the beginning of last year, she started to have pain with swallowing again and the pain was referring to her axillae.  She did  feel a mass in the right axilla, which resolved since then.  During investigation for dysphagia, she had another thyroid  ultrasound in 01/2023 which showed that the right thyroid  nodule was very slightly increased at 1 cm but contained punctate echogenic foci.     A biopsy of the right nodule in 02/2023 showed papillary thyroid  cancer.     She had R thyroidectomy in 04/2023.  Pathology was positive for oncocytic subtype of papillary thyroid  cancer measuring 1.1 cm and classic variant of papillary thyroid  cancer measuring 0.1 cm.  3 lymph nodes were tested and these were negative.   She has + FH of thyroid  disorders in: mother, M sisters and brother, MGM. No FH of thyroid  cancer.  No h/o radiation tx to head or neck.   Past Medical History:  Diagnosis Date   Acquired autoimmune hypothyroidism    Dx 04/2015.  TSH 124, FT4 0.2. TPO ab > 900   Depression    GERD (gastroesophageal reflux disease)    Seasonal allergies    takes zyrtec and flonase prn    Past Surgical History:  Procedure Laterality Date   CHOLECYSTECTOMY N/A 08/16/2022   Procedure: LAPAROSCOPIC CHOLECYSTECTOMY;  Surgeon: Dasie Leonor CROME, MD;  Location: [facility] OR;  Service: General;  Laterality: N/A;   none     THYROIDECTOMY Right 05/21/2023    Family History  Problem Relation Age of Onset   Thyroid  disease Mother        had benign tumor causing hyperthyroidism in 1991, underwent partial thyroidectomy.  Was on synthroid  for years post-op, then around 2010 she was able to  stop synthroid      Hypertension Father     Social History:  reports that she has never smoked. She has never been exposed to tobacco smoke. She has never used smokeless tobacco. She reports current alcohol use. She reports current drug use. Frequency: 3.00 times per week. Drug: Marijuana.  Allergies:  Allergies  Allergen Reactions   Strawberry (Diagnostic) Hives    Medications: I have reviewed the patient's current medications.  The PMH, PSH,  Medications, Allergies, and SH were reviewed and updated.  ROS: Constitutional: Negative for fever, weight loss and weight gain. Cardiovascular: Negative for chest pain and dyspnea on exertion. Respiratory: Is not experiencing shortness of breath at rest. Gastrointestinal: Negative for nausea and vomiting. Neurological: Negative for headaches. Psychiatric: The patient is not nervous/anxious  Blood pressure 116/74, pulse 90, temperature 98.9 F (37.2 C), height 5' 1 (1.549 m), weight 125 lb (56.7 kg), SpO2 98%. Body mass index is 23.62 kg/m.  PHYSICAL EXAM:  Exam: General: Well-developed, well-nourished Communication and Voice: Clear pitch and clarity Respiratory Respiratory effort: Equal inspiration and expiration without stridor Cardiovascular Peripheral Vascular: Warm extremities with equal color/perfusion Eyes: No nystagmus with equal extraocular motion bilaterally Neuro/Psych/Balance: Patient oriented to person, place, and time; Appropriate mood and affect; Gait is intact with no imbalance; Cranial nerves I-XII are intact Head and Face Inspection: Normocephalic and atraumatic without mass or lesion Palpation: Facial skeleton intact without bony stepoffs Salivary Glands: No mass or tenderness Facial Strength: Facial motility symmetric and full bilaterally ENT Pinna: External ear intact and fully developed External canal: Canal is patent with intact skin Tympanic Membrane: Clear and mobile External Nose: No scar or anatomic deformity Internal Nose: Septum is straight. No polyp, or purulence. Mucosal edema and erythema present.  Bilateral inferior turbinate hypertrophy.  Lips, Teeth, and gums: Mucosa and teeth intact and viable TMJ: No pain to palpation with full mobility Oral cavity/oropharynx: No erythema or exudate, no lesions present Nasopharynx: No mass or lesion with intact mucosa Hypopharynx: Intact mucosa without pooling of secretions Larynx Glottic: Full true  vocal cord mobility without lesion or mass Supraglottic: Normal appearing epiglottis and AE folds Interarytenoid Space: Moderate pachydermia&edema Subglottic Space: Patent without lesion or edema Neck Neck and Trachea: Midline trachea without mass or lesion Thyroid : No mass or nodularity Lymphatics: No lymphadenopathy  Procedure:  Preoperative diagnosis: hoarseness hx of thyroid  lobectomy   Postoperative diagnosis:   same + GERD LPR  Procedure: Flexible fiberoptic laryngoscopy with stroboscopy (68420)   Surgeon: Elena Larry, MD  Anesthesia: Topical lidocaine  and Afrin  Complications: None  Condition is stable throughout exam  Indications and consent:   The patient presents to the clinic with hoarseness. All the risks, benefits, and potential complications were reviewed with the patient preoperatively and informed verbal consent was obtained.  Procedure: The patient was seated upright in the exam chair.   Topical lidocaine  and Afrin were applied to the nasal cavity. After adequate anesthesia had occurred, the flexible telescope with strobe capabilities was passed into the nasal cavity. The nasopharynx was patent without mass or lesion. The scope was passed behind the soft palate and directed toward the base of tongue. The base of tongue was visualized and was symmetric with no apparent masses or abnormal appearing tissue. There were no signs of a mass or pooling of secretions in the piriform sinuses. The supraglottic structures were normal.  The true vocal cords are mobile. The medial edges were straight. Closure was complete. Periodicity present. The mucosal wave and amplitude were  intact. There is moderate interarytenoid pachydermia and post cricoid edema.  The laryngoscope was then slowly withdrawn and the patient tolerated the procedure well. There were no complications or blood loss.  Studies Reviewed: U/S of thyroid  01/20/24 IMPRESSION: 1. Status post right  thyroidectomy. 2. Stable heterogeneous left thyroid  gland without nodule or focal abnormality.  CT neck w/con 02/26/24 IMPRESSION: Right hemithyroidectomy with normal size remaining left lobe. No adenopathy in the neck.  Thyroglobulin 17 two months ago TSH 1.64 2 mo ago   Assessment/Plan: Encounter Diagnoses  Name Primary?   Dysphonia Yes   Hoarseness    Age-related vocal fold atrophy    Chronic sore throat     Assessment and Plan Assessment & Plan Globus, chronic sore throat and hx of GERD Dysphonia Hoarseness Strobe exam today with normal mobility of both VF, intact mucosal wave, and findings c/w GERD LPR Chronic laryngopharyngeal reflux causing hoarseness and throat irritation, likely exacerbated by dietary factors and postnasal drainage. Imaging shows no evidence of recurrence of thyroid  cancer including thyroid  U/S and neck CT. Area of concern and sensation of swollen node with normal SMG gland on exam and no palpable nodes. - Prescribed Flonase for postnasal drainage. - Provided instructions on dietary modifications to reduce reflux. -  Pepcid  20 mg BID - Recommend Reflux Gourmet supplement after meals to prevent reflux.  Allergic rhinitis with chronic nasal congestion postnasal drainage Allergic rhinitis contributing to postnasal drainage and throat irritation, potentially causing ear fullness and hoarseness. - Prescribed 5 mg daily Xyzal/Flonase for nasal congestion and postnasal drainage. - Advised on allergy management strategies.  Eustachian tube dysfunction with ear fullness Eustachian tube dysfunction causing sensation of fullness in the ears, likely related to nasal congestion and postnasal drainage.Normal ear exam.  - Provided handout on Eustachian tube dysfunction. - Advised on managing nasal congestion to alleviate ear fullness.  Hx of thyroid  cancer No evidence of recurrence based on imaging including labs, thyroid  U/S and neck CT.  - continue surveillance  with Endocrinology    Thank you for allowing me to participate in the care of this patient. Please do not hesitate to contact me with any questions or concerns.   Elena Larry, MD [facility]olaryngology [facility] Phone: [phone] Fax: [phone]    10/04/2024, 3:01 PM

## 2024-10-13 ENCOUNTER — Other Ambulatory Visit (INDEPENDENT_AMBULATORY_CARE_PROVIDER_SITE_OTHER): Payer: Self-pay | Admitting: Otolaryngology

## 2024-11-26 ENCOUNTER — Ambulatory Visit

## 2024-11-26 VITALS — BP 109/73 | HR 72 | Temp 98.1°F | Resp 16 | Ht 60.0 in | Wt 126.4 lb

## 2024-11-26 DIAGNOSIS — Z13228 Encounter for screening for other metabolic disorders: Secondary | ICD-10-CM | POA: Diagnosis not present

## 2024-11-26 DIAGNOSIS — Z13220 Encounter for screening for lipoid disorders: Secondary | ICD-10-CM

## 2024-11-26 DIAGNOSIS — Z136 Encounter for screening for cardiovascular disorders: Secondary | ICD-10-CM | POA: Diagnosis not present

## 2024-11-26 DIAGNOSIS — Z1159 Encounter for screening for other viral diseases: Secondary | ICD-10-CM

## 2024-11-26 DIAGNOSIS — Z130 Encounter for screening for diseases of the blood and blood-forming organs and certain disorders involving the immune mechanism: Secondary | ICD-10-CM

## 2024-11-26 DIAGNOSIS — R233 Spontaneous ecchymoses: Secondary | ICD-10-CM | POA: Diagnosis not present

## 2024-11-26 DIAGNOSIS — Z85850 Personal history of malignant neoplasm of thyroid: Secondary | ICD-10-CM | POA: Insufficient documentation

## 2024-11-26 DIAGNOSIS — K1120 Sialoadenitis, unspecified: Secondary | ICD-10-CM | POA: Insufficient documentation

## 2024-11-26 DIAGNOSIS — K219 Gastro-esophageal reflux disease without esophagitis: Secondary | ICD-10-CM

## 2024-11-26 DIAGNOSIS — J302 Other seasonal allergic rhinitis: Secondary | ICD-10-CM | POA: Insufficient documentation

## 2024-11-26 DIAGNOSIS — Z7689 Persons encountering health services in other specified circumstances: Secondary | ICD-10-CM

## 2024-11-26 DIAGNOSIS — Z114 Encounter for screening for human immunodeficiency virus [HIV]: Secondary | ICD-10-CM

## 2024-11-26 DIAGNOSIS — R21 Rash and other nonspecific skin eruption: Secondary | ICD-10-CM

## 2024-11-26 MED ORDER — FAMOTIDINE 20 MG PO TABS: 20.0000 mg | ORAL_TABLET | Freq: Two times a day (BID) | ORAL | 2 refills | Status: DC

## 2024-11-26 MED ORDER — HYDROCORTISONE 2.5 % EX CREA: TOPICAL_CREAM | Freq: Two times a day (BID) | CUTANEOUS | 0 refills | Status: AC

## 2024-11-26 NOTE — Progress Notes (Signed)
 Patient ID: Jacqueline Green, female    DOB: [birth year]  MRN: [id number]  CC: Establish Care   Subjective: Jacqueline Green is a 23 y.o. female with past medical history of papillary adenocarcinoma of thyroid  post thyroidectomy and hypothyroidism who presents to clinic to establish care.  Patient's main concern is small dark spots that have appeared on her stomach area that have been present since July.  Patient denies trauma, itchiness, or pain.  Denies easy bruising.  Works as an personnel officer.  Patient reports that she wore sunscreen.  Last CPE: over 3 years Last eye exam: [facility]  Last dental exam: needs to be established Exercise: gym, horse riding   Last pap: May 2025, established with gynecology LMP: 3 days ago, 3 days  Contraceptive via transdermal patch   Patient Active Problem List   Diagnosis Date Noted   History of papillary adenocarcinoma of thyroid  11/26/2024   Seasonal allergies 11/26/2024   Sialadenitis 11/26/2024   Thyroid  cancer (HCC) 05/21/2023   Nicotine dependence due to vaping tobacco product 04/10/2023   RUQ abdominal pain 07/16/2022   Other seasonal allergic rhinitis 03/29/2021   Constipation 03/29/2021   Dysphagia 03/29/2021   Family history of thyroid  disease 03/29/2021   Gastroesophageal reflux disease 03/29/2021   Hypothyroidism, unspecified 03/29/2021   Vitamin D  deficiency 03/29/2021   Acquired autoimmune hypothyroidism 10/25/2015     Current Outpatient Medications on File Prior to Visit  Medication Sig Dispense Refill   fluticasone  (FLONASE ) 50 MCG/ACT nasal spray Place 2 sprays into both nostrils 2 (two) times daily. 16 g 6   levocetirizine (XYZAL  ALLERGY 24HR) 5 MG tablet Take 1 tablet (5 mg total) by mouth every evening. 30 tablet 3   levothyroxine  (SYNTHROID ) 50 MCG tablet Take 1 tablet (50 mcg total) by mouth daily. 90 tablet 3   norelgestromin-ethinyl estradiol (XULANE) 150-35 MCG/24HR transdermal patch 1 patch once a week.      Probiotic Product (ALIGN) 4 MG CAPS Take 4 mg by mouth daily.     ergocalciferol  (VITAMIN D2) 1.25 MG (50000 UT) capsule Take 1 capsule (50,000 Units total) by mouth once a week. 12 capsule 0   No current facility-administered medications on file prior to visit.    Allergies  Allergen Reactions   Strawberry (Diagnostic) Hives   Strawberry Extract Hives    Social History   Socioeconomic History   Marital status: Single    Spouse name: Not on file   Number of children: Not on file   Years of education: Not on file   Highest education level: 12th grade  Occupational History   Not on file  Tobacco Use   Smoking status: Never    Passive exposure: Never   Smokeless tobacco: Never   Tobacco comments:    vaping  Vaping Use   Vaping status: Every Day   Substances: Nicotine  Substance and Sexual Activity   Alcohol use: Yes    Comment: just started 08/04/22- turned 21- drank a few days after that 3 beers or tequeluia- last time 08/13/22   Drug use: Yes    Frequency: 3.0 times per week    Types: Marijuana    Comment: Last time was 08/11/22   Sexual activity: Not on file  Other Topics Concern   Not on file  Social History Narrative   Student at [employer]   Social Drivers of Health   Financial Resource Strain: Medium Risk (11/26/2024)   Overall Financial Resource Strain (CARDIA)  Difficulty of Paying Living Expenses: Somewhat hard  Food Insecurity: No Food Insecurity (11/26/2024)   Hunger Vital Sign    Worried About Running Out of Food in the Last Year: Never true    Ran Out of Food in the Last Year: Never true  Transportation Needs: No Transportation Needs (11/26/2024)   PRAPARE - Administrator, Civil Service (Medical): No    Lack of Transportation (Non-Medical): No  Physical Activity: Sufficiently Active (11/26/2024)   Exercise Vital Sign    Days of Exercise per Week: 7 days    Minutes of Exercise per Session: 150+ min  Stress: Stress Concern Present (11/26/2024)    [employer] of Occupational Health - Occupational Stress Questionnaire    Feeling of Stress: To some extent  Social Connections: Moderately Integrated (11/26/2024)   Social Connection and Isolation Panel    Frequency of Communication with Friends and Family: More than three times a week    Frequency of Social Gatherings with Friends and Family: Three times a week    Attends Religious Services: More than 4 times per year    Active Member of Clubs or Organizations: Yes    Attends Banker Meetings: More than 4 times per year    Marital Status: Never married  Intimate Partner Violence: Not At Risk (11/26/2024)   Humiliation, Afraid, Rape, and Kick questionnaire    Fear of Current or Ex-Partner: No    Emotionally Abused: No    Physically Abused: No    Sexually Abused: No    Family History  Problem Relation Age of Onset   Thyroid  disease Mother        had benign tumor causing hyperthyroidism in 1991, underwent partial thyroidectomy.  Was on synthroid  for years post-op, then around 2010 she was able to stop synthroid      Hypertension Father    Thyroid  disease Maternal Aunt    Thyroid  disease Maternal Aunt    Thyroid  disease Maternal Aunt    Thyroid  disease Maternal Uncle    Dementia Maternal Grandmother    Thyroid  disease Other    Dementia Maternal Great-grandmother     Past Surgical History:  Procedure Laterality Date   CHOLECYSTECTOMY N/A 08/16/2022   Procedure: LAPAROSCOPIC CHOLECYSTECTOMY;  Surgeon: Dasie Leonor CROME, MD;  Location: [facility] OR;  Service: General;  Laterality: N/A;   none     THYROIDECTOMY Right 05/21/2023    ROS: Review of Systems Negative except as stated above  PHYSICAL EXAM: BP 109/73   Pulse 72   Temp 98.1 F (36.7 C) (Oral)   Resp 16   Ht 5' (1.524 m)   Wt 126 lb 6.4 oz (57.3 kg)   SpO2 98%   BMI 24.69 kg/m   Physical Exam  General: well-appearing, no acute distress Skin: 4 small less than 5 mm in diameter round hyperpigmented  macular areas on skin on abdomen.  Nonpainful to palpation Cardiovascular: regular heart rate and rhythm, normal S1/S2, no murmurs, gallops, or rubs, peripheral pulses 2+ bilaterally Chest: no skeletal deformity, lungs clear to auscultation bilaterally, equal breath sounds bilaterally Abdomen: soft, non-distended, non-tender to palpation, no hepatomegaly, no splenomegaly, normoactive bowel sounds.  Musculoskeletal: strength of upper and lower extremities equal bilaterally, normal gait Extremities: no peripheral edema   ASSESSMENT AND PLAN:  1. Encounter to establish care with new provider  2. Easy bruising (Primary) - CBC to screen for anemia  3. Rash and nonspecific skin eruption - Labs to assess for other causes of dark  spots in on abdomen.  Trial steroid cream.  Will continue to assess. - hydrocortisone  2.5 % cream; Apply topically 2 (two) times daily.  Dispense: 20 g; Refill: 0  4. Screening for metabolic disorder - Comprehensive metabolic panel with GFR  5. Screening for blood disease -CBC to screen for anemia as cause for easy bruising  6. Encounter for hepatitis C screening test for low risk patient - Hepatitis C antibody  7. Encounter for screening for HIV - HIV Antibody (routine testing w rflx)  8. Encounter for lipid screening for cardiovascular disease - Lipid panel  9. Gastroesophageal reflux disease without esophagitis - Symptoms have improved - Continue famotidine  (PEPCID ) 20 MG tablet; Take 1 tablet (20 mg total) by mouth 2 (two) times daily.  Dispense: 180 tablet; Refill: 2    Patient was given the opportunity to ask questions.  Patient verbalized understanding of the plan and was able to repeat key elements of the plan.    Orders Placed This Encounter  Procedures   CBC   Comprehensive metabolic panel with GFR   HIV Antibody (routine testing w rflx)   Hepatitis C antibody   Lipid panel     Requested Prescriptions   Signed Prescriptions Disp  Refills   famotidine  (PEPCID ) 20 MG tablet 180 tablet 2    Sig: Take 1 tablet (20 mg total) by mouth 2 (two) times daily.   hydrocortisone  2.5 % cream 20 g 0    Sig: Apply topically 2 (two) times daily.    Return in about 1 month (around 12/27/2024) for physical.  Sula Leavy Rode, PA-C

## 2024-11-30 ENCOUNTER — Ambulatory Visit: Payer: Self-pay

## 2024-11-30 LAB — CBC
Hematocrit: 42 % (ref 34.0–46.6)
Hemoglobin: 13.3 g/dL (ref 11.1–15.9)
MCH: 28.9 pg (ref 26.6–33.0)
MCHC: 31.7 g/dL (ref 31.5–35.7)
MCV: 91 fL (ref 79–97)
Platelets: 251 x10E3/uL (ref 150–450)
RBC: 4.61 x10E6/uL (ref 3.77–5.28)
RDW: 12.2 % (ref 11.7–15.4)
WBC: 7.7 x10E3/uL (ref 3.4–10.8)

## 2024-11-30 LAB — LIPID PANEL
Chol/HDL Ratio: 1.8 ratio (ref 0.0–4.4)
Cholesterol, Total: 160 mg/dL (ref 100–199)
HDL: 88 mg/dL (ref >39)
LDL Chol Calc (NIH): 61 mg/dL (ref 0–99)
Triglycerides: 53 mg/dL (ref 0–149)
VLDL Cholesterol Cal: 11 mg/dL (ref 5–40)

## 2024-11-30 LAB — COMPREHENSIVE METABOLIC PANEL WITH GFR
ALT: 20 IU/L (ref 0–32)
AST: 26 IU/L (ref 0–40)
Albumin: 4.6 g/dL (ref 4.0–5.0)
Alkaline Phosphatase: 55 IU/L (ref 41–116)
BUN/Creatinine Ratio: 13 (ref 9–23)
BUN: 8 mg/dL (ref 6–20)
Bilirubin Total: 0.4 mg/dL (ref 0.0–1.2)
CO2: 21 mmol/L (ref 20–29)
Calcium: 9.1 mg/dL (ref 8.7–10.2)
Chloride: 107 mmol/L — ABNORMAL HIGH (ref 96–106)
Creatinine, Ser: 0.63 mg/dL (ref 0.57–1.00)
Globulin, Total: 1.9 g/dL (ref 1.5–4.5)
Glucose: 69 mg/dL — ABNORMAL LOW (ref 70–99)
Potassium: 4.5 mmol/L (ref 3.5–5.2)
Sodium: 143 mmol/L (ref 134–144)
Total Protein: 6.5 g/dL (ref 6.0–8.5)
eGFR: 128 mL/min/1.73 (ref >59)

## 2024-11-30 LAB — HIV ANTIBODY (ROUTINE TESTING W REFLEX): HIV Screen 4th Generation wRfx: NONREACTIVE

## 2024-11-30 LAB — HEPATITIS C ANTIBODY: Hep C Virus Ab: NONREACTIVE

## 2024-12-10 DIAGNOSIS — F4312 Post-traumatic stress disorder, chronic: Secondary | ICD-10-CM | POA: Diagnosis not present

## 2024-12-29 ENCOUNTER — Other Ambulatory Visit: Payer: Self-pay | Admitting: Internal Medicine

## 2024-12-30 ENCOUNTER — Ambulatory Visit

## 2024-12-30 VITALS — BP 114/59 | HR 102 | Temp 98.1°F | Resp 17 | Ht 64.0 in | Wt 128.6 lb

## 2024-12-30 DIAGNOSIS — K219 Gastro-esophageal reflux disease without esophagitis: Secondary | ICD-10-CM

## 2024-12-30 DIAGNOSIS — Z0001 Encounter for general adult medical examination with abnormal findings: Secondary | ICD-10-CM

## 2024-12-30 DIAGNOSIS — Z0000 Encounter for general adult medical examination without abnormal findings: Secondary | ICD-10-CM

## 2024-12-30 DIAGNOSIS — R21 Rash and other nonspecific skin eruption: Secondary | ICD-10-CM | POA: Diagnosis not present

## 2024-12-30 DIAGNOSIS — Z30019 Encounter for initial prescription of contraceptives, unspecified: Secondary | ICD-10-CM

## 2024-12-30 MED ORDER — FAMOTIDINE 20 MG PO TABS: 20.0000 mg | ORAL_TABLET | Freq: Two times a day (BID) | ORAL | 2 refills | Status: AC

## 2024-12-30 MED ORDER — NORELGESTROMIN-ETH ESTRADIOL 150-35 MCG/24HR TD PTWK: 1.0000 | MEDICATED_PATCH | TRANSDERMAL | 1 refills | Status: AC

## 2024-12-30 NOTE — Progress Notes (Unsigned)
 "    Patient ID: Jacqueline Green, female    DOB: May 22, 2001  MRN: [id number]  CC: Annual Exam   Subjective: Jacqueline Green is a 24 y.o. female with past medical history of hypothyroidism who presents to clinic for Annual physical exam. Reports steroid cream has not helped get rid of dark spots on abdomen.   Patient Active Problem List   Diagnosis Date Noted   History of papillary adenocarcinoma of thyroid  11/26/2024   Seasonal allergies 11/26/2024   Sialadenitis 11/26/2024   Thyroid  cancer (HCC) 05/21/2023   Nicotine dependence due to vaping tobacco product 04/10/2023   RUQ abdominal pain 07/16/2022   Other seasonal allergic rhinitis 03/29/2021   Constipation 03/29/2021   Dysphagia 03/29/2021   Family history of thyroid  disease 03/29/2021   Gastroesophageal reflux disease 03/29/2021   Hypothyroidism, unspecified 03/29/2021   Vitamin D  deficiency 03/29/2021   Acquired autoimmune hypothyroidism 10/25/2015     Medications Ordered Prior to Encounter[1]  Allergies[2]  Social History   Socioeconomic History   Marital status: Single    Spouse name: Not on file   Number of children: Not on file   Years of education: Not on file   Highest education level: 12th grade  Occupational History   Not on file  Tobacco Use   Smoking status: Never    Passive exposure: Never   Smokeless tobacco: Never   Tobacco comments:    vaping  Vaping Use   Vaping status: Every Day   Substances: Nicotine  Substance and Sexual Activity   Alcohol use: Yes    Comment: just started 08/04/22- turned 21- drank a few days after that 3 beers or tequeluia- last time 08/13/22   Drug use: Yes    Frequency: 3.0 times per week    Types: Marijuana    Comment: Last time was 08/11/22   Sexual activity: Not on file  Other Topics Concern   Not on file  Social History Narrative   Student at [employer]   Social Drivers of Health   Tobacco Use: Low Risk (11/26/2024)   Patient History    Smoking Tobacco Use: Never     Smokeless Tobacco Use: Never    Passive Exposure: Never  Financial Resource Strain: Medium Risk (11/26/2024)   Overall Financial Resource Strain (CARDIA)    Difficulty of Paying Living Expenses: Somewhat hard  Food Insecurity: No Food Insecurity (11/26/2024)   Epic    Worried About Programme Researcher, Broadcasting/film/video in the Last Year: Never true    [employer] of Food in the Last Year: Never true  Transportation Needs: No Transportation Needs (11/26/2024)   Epic    Lack of Transportation (Medical): No    Lack of Transportation (Non-Medical): No  Physical Activity: Sufficiently Active (11/26/2024)   Exercise Vital Sign    Days of Exercise per Week: 7 days    Minutes of Exercise per Session: 150+ min  Stress: Stress Concern Present (11/26/2024)   [employer] of Occupational Health - Occupational Stress Questionnaire    Feeling of Stress: To some extent  Social Connections: Moderately Integrated (11/26/2024)   Social Connection and Isolation Panel    Frequency of Communication with Friends and Family: More than three times a week    Frequency of Social Gatherings with Friends and Family: Three times a week    Attends Religious Services: More than 4 times per year    Active Member of Clubs or Organizations: Yes    Attends Banker Meetings: More than 4  times per year    Marital Status: Never married  Intimate Partner Violence: Not At Risk (11/26/2024)   Epic    Fear of Current or Ex-Partner: No    Emotionally Abused: No    Physically Abused: No    Sexually Abused: No  Depression (PHQ2-9): Medium Risk (11/26/2024)   Depression (PHQ2-9)    PHQ-2 Score: 7  Alcohol Screen: Medium Risk (11/26/2024)   Alcohol Screen    Last Alcohol Screening Score (AUDIT): 8  Housing: Low Risk (11/26/2024)   Epic    Unable to Pay for Housing in the Last Year: No    Number of Times Moved in the Last Year: 0    Homeless in the Last Year: No  Utilities: Not At Risk (05/21/2023)   Received from [facility] Utilities    Threatened with loss of utilities: No  Health Literacy: Adequate Health Literacy (11/26/2024)   B1300 Health Literacy    Frequency of need for help with medical instructions: Never    Family History  Problem Relation Age of Onset   Thyroid  disease Mother        had benign tumor causing hyperthyroidism in 1991, underwent partial thyroidectomy.  Was on synthroid  for years post-op, then around 2010 she was able to stop synthroid      Hypertension Father    Thyroid  disease Maternal Aunt    Thyroid  disease Maternal Aunt    Thyroid  disease Maternal Aunt    Thyroid  disease Maternal Uncle    Dementia Maternal Grandmother    Thyroid  disease Other    Dementia Maternal Great-grandmother     Past Surgical History:  Procedure Laterality Date   CHOLECYSTECTOMY N/A 08/16/2022   Procedure: LAPAROSCOPIC CHOLECYSTECTOMY;  Surgeon: Dasie Leonor CROME, MD;  Location: [facility] OR;  Service: General;  Laterality: N/A;   none     THYROIDECTOMY Right 05/21/2023    ROS: Review of Systems Negative except as stated above  PHYSICAL EXAM: BP (!) 114/59   Pulse (!) 102   Temp 98.1 F (36.7 C) (Oral)   Resp 17   Ht 5' 4 (1.626 m)   Wt 128 lb 9.6 oz (58.3 kg)   SpO2 97%   BMI 22.07 kg/m   Physical Exam  General: well-appearing, no acute distress Skin: multiple small dark spots on abdomen Head: normocephalic, no lesions, no abnormal hair distribution or loss Eyes: anicteric sclera, pupils equally round and reactive to light and accommodation, extraocular movements intact, appropriate visual acuity Ears: no external lesions, tympanic membrane translucent  Nose: no septal deviation, turbinates clear Throat: trachea midline, no thyromegaly, moist mucus membranes Cardiovascular: regular heart rate and rhythm, normal S1/S2, no murmurs, gallops, or rubs, peripheral pulses 2+ bilaterally Chest: no skeletal deformity, lungs clear to auscultation bilaterally, equal breath sounds  bilaterally Abdomen: soft, non-distended, non-tender to palpation, no hepatomegaly, no splenomegaly, normoactive bowel sounds Musculoskeletal: strength of upper and lower extremities equal bilaterally, 5/5, normal gait Extremities: no peripheral edema, nails intact Neurologic: cranial nerves II-XII intact      Latest Ref Rng & Units 11/26/2024    8:44 AM 09/01/2022   10:45 AM 07/02/2022    9:09 AM  CMP  Glucose 70 - 99 mg/dL 69  92  81   BUN 6 - 20 mg/dL 8  8  8    Creatinine 0.57 - 1.00 mg/dL 9.36  9.34  9.38   Sodium 134 - 144 mmol/L 143  141  143   Potassium 3.5 -  5.2 mmol/L 4.5  4.1  3.9   Chloride 96 - 106 mmol/L 107  107  112   CO2 20 - 29 mmol/L 21  28  26    Calcium 8.7 - 10.2 mg/dL 9.1  9.1  9.5   Total Protein 6.0 - 8.5 g/dL 6.5  7.2  7.2   Total Bilirubin 0.0 - 1.2 mg/dL 0.4  0.5  0.9   Alkaline Phos 41 - 116 IU/L 55  62  44   AST 0 - 40 IU/L 26  29  26    ALT 0 - 32 IU/L 20  31  36    Lipid Panel     Component Value Date/Time   CHOL 160 11/26/2024 0844   TRIG 53 11/26/2024 0844   HDL 88 11/26/2024 0844   CHOLHDL 1.8 11/26/2024 0844   LDLCALC 61 11/26/2024 0844    CBC    Component Value Date/Time   WBC 7.7 11/26/2024 0844   WBC 8.7 09/01/2022 1045   RBC 4.61 11/26/2024 0844   RBC 4.49 09/01/2022 1045   HGB 13.3 11/26/2024 0844   HCT 42.0 11/26/2024 0844   PLT 251 11/26/2024 0844   MCV 91 11/26/2024 0844   MCH 28.9 11/26/2024 0844   MCH 28.7 09/01/2022 1045   MCHC 31.7 11/26/2024 0844   MCHC 33.0 09/01/2022 1045   RDW 12.2 11/26/2024 0844   LYMPHSABS 1.1 09/01/2022 1045   MONOABS 0.5 09/01/2022 1045   EOSABS 0.1 09/01/2022 1045   BASOSABS 0.1 09/01/2022 1045    ASSESSMENT AND PLAN:  1. Encounter for annual wellness visit (Primary) - Complete physical exam performed today, findings listed above  2. Rash and nonspecific skin eruption - Ambulatory referral to Dermatology  3. Gastroesophageal reflux disease without esophagitis - Symptoms well  controlled - Continue famotidine  (PEPCID ) 20 MG tablet; Take 1 tablet (20 mg total) by mouth 2 (two) times daily.  Dispense: 180 tablet; Refill: 2    Patient was given the opportunity to ask questions.  Patient verbalized understanding of the plan and was able to repeat key elements of the plan.    Orders Placed This Encounter  Procedures   Ambulatory referral to Dermatology     Requested Prescriptions   Signed Prescriptions Disp Refills   famotidine  (PEPCID ) 20 MG tablet 180 tablet 2    Sig: Take 1 tablet (20 mg total) by mouth 2 (two) times daily.   norelgestromin -ethinyl estradiol  (XULANE) 150-35 MCG/24HR transdermal patch 6 patch 1    Sig: Place 1 patch onto the skin once a week.    Return in about 1 year (around 12/30/2025) for physical, labs.  Sula Cower Brindley Madarang, PA-C      [1]  Current Outpatient Medications on File Prior to Visit  Medication Sig Dispense Refill   ergocalciferol  (VITAMIN D2) 1.25 MG (50000 UT) capsule Take 1 capsule (50,000 Units total) by mouth once a week. 12 capsule 0   fluticasone  (FLONASE ) 50 MCG/ACT nasal spray Place 2 sprays into both nostrils 2 (two) times daily. 16 g 6   hydrocortisone  2.5 % cream Apply topically 2 (two) times daily. 20 g 0   levocetirizine (XYZAL  ALLERGY 24HR) 5 MG tablet Take 1 tablet (5 mg total) by mouth every evening. 30 tablet 3   levothyroxine  (SYNTHROID ) 50 MCG tablet TAKE 1 TABLET BY MOUTH EVERY DAY 90 tablet 2   Probiotic Product (ALIGN) 4 MG CAPS Take 4 mg by mouth daily.     No current facility-administered medications on file prior  to visit.  [2]  Allergies Allergen Reactions   Strawberry (Diagnostic) Hives   Strawberry Extract Hives   "

## 2025-07-25 ENCOUNTER — Ambulatory Visit: Admitting: Internal Medicine

## 2025-09-13 ENCOUNTER — Ambulatory Visit: Admitting: Physician Assistant
